# Patient Record
Sex: Female | Born: 1960 | Race: Black or African American | Hispanic: No | Marital: Married | Smoking: Never smoker
Health system: Southern US, Community
[De-identification: ages and names within clinical notes are randomized; demographics above are authoritative.]

## PROBLEM LIST (undated history)

## (undated) DIAGNOSIS — D649 Anemia, unspecified: Secondary | ICD-10-CM

## (undated) DIAGNOSIS — E7800 Pure hypercholesterolemia, unspecified: Secondary | ICD-10-CM

## (undated) DIAGNOSIS — K219 Gastro-esophageal reflux disease without esophagitis: Secondary | ICD-10-CM

## (undated) DIAGNOSIS — R7309 Other abnormal glucose: Secondary | ICD-10-CM

## (undated) DIAGNOSIS — R42 Dizziness and giddiness: Secondary | ICD-10-CM

## (undated) DIAGNOSIS — I10 Essential (primary) hypertension: Secondary | ICD-10-CM

## (undated) HISTORY — DX: Dizziness and giddiness: R42

## (undated) HISTORY — DX: Anemia, unspecified: D64.9

## (undated) HISTORY — DX: Other abnormal glucose: R73.09

## (undated) HISTORY — PX: COLONOSCOPY: SHX174

---

## 2002-04-28 ENCOUNTER — Ambulatory Visit (HOSPITAL_COMMUNITY): Admission: RE | Admit: 2002-04-28 | Discharge: 2002-04-28 | Payer: Self-pay | Admitting: Family Medicine

## 2002-04-28 ENCOUNTER — Encounter: Payer: Self-pay | Admitting: Family Medicine

## 2003-12-06 ENCOUNTER — Ambulatory Visit (HOSPITAL_COMMUNITY): Admission: RE | Admit: 2003-12-06 | Discharge: 2003-12-06 | Payer: Self-pay | Admitting: Family Medicine

## 2004-01-20 ENCOUNTER — Ambulatory Visit (HOSPITAL_COMMUNITY): Admission: RE | Admit: 2004-01-20 | Discharge: 2004-01-20 | Payer: Self-pay | Admitting: Family Medicine

## 2004-02-02 ENCOUNTER — Ambulatory Visit (HOSPITAL_COMMUNITY): Admission: RE | Admit: 2004-02-02 | Discharge: 2004-02-02 | Payer: Self-pay | Admitting: Family Medicine

## 2004-03-31 ENCOUNTER — Ambulatory Visit (HOSPITAL_COMMUNITY): Admission: RE | Admit: 2004-03-31 | Discharge: 2004-03-31 | Payer: Self-pay | Admitting: Otolaryngology

## 2004-08-01 ENCOUNTER — Ambulatory Visit: Payer: Self-pay | Admitting: Family Medicine

## 2004-11-23 ENCOUNTER — Ambulatory Visit: Payer: Self-pay | Admitting: Family Medicine

## 2004-12-08 ENCOUNTER — Ambulatory Visit: Payer: Self-pay | Admitting: Family Medicine

## 2005-03-05 ENCOUNTER — Ambulatory Visit: Payer: Self-pay | Admitting: Family Medicine

## 2005-03-16 ENCOUNTER — Ambulatory Visit (HOSPITAL_COMMUNITY): Admission: RE | Admit: 2005-03-16 | Discharge: 2005-03-16 | Payer: Self-pay | Admitting: Family Medicine

## 2005-07-10 ENCOUNTER — Ambulatory Visit: Payer: Self-pay | Admitting: Family Medicine

## 2005-08-28 ENCOUNTER — Ambulatory Visit: Payer: Self-pay | Admitting: Family Medicine

## 2005-09-19 ENCOUNTER — Encounter: Payer: Self-pay | Admitting: Family Medicine

## 2005-09-19 LAB — CONVERTED CEMR LAB: TSH: 1.111 microintl units/mL

## 2006-01-14 ENCOUNTER — Ambulatory Visit: Payer: Self-pay | Admitting: Family Medicine

## 2006-01-14 ENCOUNTER — Encounter: Payer: Self-pay | Admitting: Family Medicine

## 2006-01-14 ENCOUNTER — Other Ambulatory Visit: Admission: RE | Admit: 2006-01-14 | Discharge: 2006-01-14 | Payer: Self-pay | Admitting: Family Medicine

## 2006-02-25 ENCOUNTER — Ambulatory Visit: Payer: Self-pay | Admitting: Family Medicine

## 2006-04-30 ENCOUNTER — Ambulatory Visit: Payer: Self-pay | Admitting: Family Medicine

## 2007-02-21 ENCOUNTER — Other Ambulatory Visit: Admission: RE | Admit: 2007-02-21 | Discharge: 2007-02-21 | Payer: Self-pay | Admitting: Family Medicine

## 2007-02-21 ENCOUNTER — Encounter: Payer: Self-pay | Admitting: Family Medicine

## 2007-02-21 ENCOUNTER — Ambulatory Visit: Payer: Self-pay | Admitting: Family Medicine

## 2007-02-21 LAB — CONVERTED CEMR LAB: Pap Smear: NORMAL

## 2007-02-28 ENCOUNTER — Encounter: Payer: Self-pay | Admitting: Family Medicine

## 2007-02-28 LAB — CONVERTED CEMR LAB
BUN: 8 mg/dL (ref 6–23)
Basophils Absolute: 0 10*3/uL (ref 0.0–0.1)
Basophils Relative: 1 % (ref 0–1)
CO2: 27 meq/L (ref 19–32)
Calcium: 9.4 mg/dL (ref 8.4–10.5)
Chloride: 104 meq/L (ref 96–112)
Cholesterol: 296 mg/dL — ABNORMAL HIGH (ref 0–200)
Creatinine, Ser: 0.66 mg/dL (ref 0.40–1.20)
Eosinophils Absolute: 0.1 10*3/uL (ref 0.0–0.7)
Eosinophils Relative: 2 % (ref 0–5)
Glucose, Bld: 100 mg/dL — ABNORMAL HIGH (ref 70–99)
HCT: 32.4 % — ABNORMAL LOW (ref 36.0–46.0)
HDL: 55 mg/dL (ref >39)
Hemoglobin: 10 g/dL — ABNORMAL LOW (ref 12.0–15.0)
LDL Cholesterol: 224 mg/dL — ABNORMAL HIGH (ref 0–99)
Lymphocytes Relative: 30 % (ref 12–46)
Lymphs Abs: 1.8 10*3/uL (ref 0.7–3.3)
MCHC: 30.9 g/dL (ref 30.0–36.0)
MCV: 82.7 fL (ref 78.0–100.0)
Monocytes Absolute: 0.5 10*3/uL (ref 0.2–0.7)
Monocytes Relative: 8 % (ref 3–11)
Neutro Abs: 3.7 10*3/uL (ref 1.7–7.7)
Neutrophils Relative %: 61 % (ref 43–77)
Platelets: 425 10*3/uL — ABNORMAL HIGH (ref 150–400)
Potassium: 3.6 meq/L (ref 3.5–5.3)
RBC: 3.92 M/uL (ref 3.87–5.11)
RDW: 15.4 % — ABNORMAL HIGH (ref 11.5–14.0)
Sodium: 141 meq/L (ref 135–145)
Total CHOL/HDL Ratio: 5.4
Triglycerides: 85 mg/dL (ref <150)
VLDL: 17 mg/dL (ref 0–40)
WBC: 6.1 10*3/uL (ref 4.0–10.5)

## 2007-03-03 ENCOUNTER — Encounter: Payer: Self-pay | Admitting: Family Medicine

## 2007-03-03 LAB — CONVERTED CEMR LAB
ALT: 14 units/L (ref 0–35)
AST: 19 units/L (ref 0–37)
Albumin: 4.5 g/dL (ref 3.5–5.2)
Alkaline Phosphatase: 57 units/L (ref 39–117)
Bilirubin, Direct: <0.1 mg/dL (ref 0.0–0.3)
Ferritin: 14 ng/mL (ref 10–291)
Folate: >20 ng/mL
Iron: 28 ug/dL — ABNORMAL LOW (ref 42–145)
Saturation Ratios: 8 % — ABNORMAL LOW (ref 20–55)
TIBC: 356 ug/dL (ref 250–470)
Total Bilirubin: 0.2 mg/dL — ABNORMAL LOW (ref 0.3–1.2)
Total Protein: 7.2 g/dL (ref 6.0–8.3)
UIBC: 328 ug/dL
Vitamin B-12: 564 pg/mL (ref 211–911)

## 2007-08-08 ENCOUNTER — Ambulatory Visit: Payer: Self-pay | Admitting: Family Medicine

## 2007-09-04 ENCOUNTER — Encounter: Payer: Self-pay | Admitting: Family Medicine

## 2007-09-23 ENCOUNTER — Encounter: Payer: Self-pay | Admitting: Family Medicine

## 2007-09-23 DIAGNOSIS — N760 Acute vaginitis: Secondary | ICD-10-CM | POA: Insufficient documentation

## 2007-09-23 DIAGNOSIS — N951 Menopausal and female climacteric states: Secondary | ICD-10-CM

## 2007-09-23 DIAGNOSIS — E785 Hyperlipidemia, unspecified: Secondary | ICD-10-CM

## 2007-09-23 DIAGNOSIS — I10 Essential (primary) hypertension: Secondary | ICD-10-CM

## 2007-10-20 ENCOUNTER — Emergency Department (HOSPITAL_COMMUNITY): Admission: EM | Admit: 2007-10-20 | Discharge: 2007-10-21 | Payer: Self-pay | Admitting: Emergency Medicine

## 2007-10-21 ENCOUNTER — Ambulatory Visit (HOSPITAL_COMMUNITY): Admission: RE | Admit: 2007-10-21 | Discharge: 2007-10-21 | Payer: Self-pay | Admitting: Emergency Medicine

## 2009-02-28 ENCOUNTER — Emergency Department (HOSPITAL_COMMUNITY): Admission: EM | Admit: 2009-02-28 | Discharge: 2009-02-28 | Payer: Self-pay | Admitting: Emergency Medicine

## 2009-07-01 ENCOUNTER — Ambulatory Visit (HOSPITAL_COMMUNITY): Admission: RE | Admit: 2009-07-01 | Discharge: 2009-07-01 | Payer: Self-pay | Admitting: Obstetrics and Gynecology

## 2010-09-24 ENCOUNTER — Encounter: Payer: Self-pay | Admitting: Family Medicine

## 2010-10-03 NOTE — Letter (Signed)
Summary: RPC chart  RPC chart   Imported By: Curtis Sites 03/08/2010 13:23:56  _____________________________________________________________________  External Attachment:    Type:   Image     Comment:   External Document

## 2010-12-07 LAB — COMPREHENSIVE METABOLIC PANEL
ALT: 13 U/L (ref 0–35)
AST: 18 U/L (ref 0–37)
Albumin: 4 g/dL (ref 3.5–5.2)
Alkaline Phosphatase: 56 U/L (ref 39–117)
BUN: 4 mg/dL — ABNORMAL LOW (ref 6–23)
CO2: 28 mEq/L (ref 19–32)
Calcium: 9.2 mg/dL (ref 8.4–10.5)
Chloride: 102 mEq/L (ref 96–112)
Creatinine, Ser: 0.54 mg/dL (ref 0.4–1.2)
GFR calc Af Amer: >60 mL/min (ref >60)
GFR calc non Af Amer: >60 mL/min (ref >60)
Glucose, Bld: 124 mg/dL — ABNORMAL HIGH (ref 70–99)
Potassium: 2.9 mEq/L — ABNORMAL LOW (ref 3.5–5.1)
Sodium: 137 mEq/L (ref 135–145)
Total Bilirubin: 0.2 mg/dL — ABNORMAL LOW (ref 0.3–1.2)
Total Protein: 6.7 g/dL (ref 6.0–8.3)

## 2010-12-07 LAB — CBC
HCT: 31.2 % — ABNORMAL LOW (ref 36.0–46.0)
Hemoglobin: 10.1 g/dL — ABNORMAL LOW (ref 12.0–15.0)
MCHC: 32.5 g/dL (ref 30.0–36.0)
MCV: 75.3 fL — ABNORMAL LOW (ref 78.0–100.0)
Platelets: 341 10*3/uL (ref 150–400)
RDW: 15.4 % (ref 11.5–15.5)
WBC: 6.4 10*3/uL (ref 4.0–10.5)

## 2010-12-07 LAB — HCG, QUANTITATIVE, PREGNANCY: hCG, Beta Chain, Quant, S: <2 m[IU]/mL (ref <5)

## 2010-12-11 LAB — URINALYSIS, ROUTINE W REFLEX MICROSCOPIC
Glucose, UA: NEGATIVE mg/dL
Leukocytes, UA: NEGATIVE
Nitrite: NEGATIVE
Protein, ur: NEGATIVE mg/dL
Specific Gravity, Urine: 1.01 (ref 1.005–1.030)
Urobilinogen, UA: 0.2 mg/dL (ref 0.0–1.0)
pH: 7 (ref 5.0–8.0)

## 2010-12-11 LAB — URINE MICROSCOPIC-ADD ON

## 2010-12-11 LAB — DIFFERENTIAL
Basophils Relative: 0 % (ref 0–1)
Eosinophils Absolute: 0 10*3/uL (ref 0.0–0.7)
Eosinophils Relative: 0 % (ref 0–5)
Lymphocytes Relative: 14 % (ref 12–46)
Lymphs Abs: 1.4 10*3/uL (ref 0.7–4.0)
Neutro Abs: 8.7 10*3/uL — ABNORMAL HIGH (ref 1.7–7.7)

## 2010-12-11 LAB — CBC
HCT: 32.3 % — ABNORMAL LOW (ref 36.0–46.0)
MCV: 88.8 fL (ref 78.0–100.0)
RDW: 15 % (ref 11.5–15.5)

## 2010-12-11 LAB — BASIC METABOLIC PANEL
CO2: 26 mEq/L (ref 19–32)
GFR calc Af Amer: >60 mL/min (ref >60)
GFR calc non Af Amer: >60 mL/min (ref >60)
Potassium: 3.1 mEq/L — ABNORMAL LOW (ref 3.5–5.1)

## 2010-12-11 LAB — WET PREP, GENITAL: Yeast Wet Prep HPF POC: NONE SEEN

## 2012-05-04 ENCOUNTER — Encounter (HOSPITAL_COMMUNITY): Payer: Self-pay | Admitting: Emergency Medicine

## 2012-05-04 ENCOUNTER — Emergency Department (HOSPITAL_COMMUNITY)
Admission: EM | Admit: 2012-05-04 | Discharge: 2012-05-04 | Disposition: A | Discharge status: Home / Self Care | Payer: Self-pay | Attending: Emergency Medicine | Admitting: Emergency Medicine

## 2012-05-04 DIAGNOSIS — K644 Residual hemorrhoidal skin tags: Secondary | ICD-10-CM | POA: Insufficient documentation

## 2012-05-04 DIAGNOSIS — I10 Essential (primary) hypertension: Secondary | ICD-10-CM | POA: Insufficient documentation

## 2012-05-04 DIAGNOSIS — E7800 Pure hypercholesterolemia, unspecified: Secondary | ICD-10-CM | POA: Insufficient documentation

## 2012-05-04 HISTORY — DX: Pure hypercholesterolemia, unspecified: E78.00

## 2012-05-04 HISTORY — DX: Essential (primary) hypertension: I10

## 2012-05-04 NOTE — ED Provider Notes (Signed)
History     CSN: [id number]  Arrival date & time 05/04/12  1539   First MD Initiated Contact with Patient 05/04/12 1726      Chief Complaint  Patient presents with  . Hemorrhoids    (Consider location/radiation/quality/duration/timing/severity/associated sxs/prior treatment) HPI Comments: Pt has had  Hemorrhoids ~ 20 years but they have enlarged and become more painful in the past couple weeks.  She saw her NP provider at [facility] and was prescribed a cortisone suppositories and lidocaine jelly.  "not helping".  The history is provided by the patient. No language interpreter was used.    Past Medical History  Diagnosis Date  . Hypertension   . High cholesterol     Past Surgical History  Procedure Date  . Cesarean section     No family history on file.  History  Substance Use Topics  . Smoking status: Never Smoker   . Smokeless tobacco: Not on file  . Alcohol Use: No    OB History    Grav Para Term Preterm Abortions TAB SAB Ect Mult Living                  Review of Systems  Constitutional: Negative for fever.  Gastrointestinal: Positive for rectal pain. Negative for diarrhea and constipation.  All other systems reviewed and are negative.    Allergies  Meperidine hcl  Home Medications  No current outpatient prescriptions on file.  BP 143/84  Pulse 85  Temp 98.7 F (37.1 C) (Oral)  Resp 20  Ht 5\' 4"  (1.626 m)  Wt 162 lb (73.483 kg)  BMI 27.81 kg/m2  SpO2 100%  Physical Exam  Nursing note and vitals reviewed. Constitutional: She is oriented to person, place, and time. She appears well-developed and well-nourished. No distress.  HENT:  Head: Normocephalic and atraumatic.  Eyes: EOM are normal.  Neck: Normal range of motion.  Cardiovascular: Normal rate, regular rhythm and normal heart sounds.   Pulmonary/Chest: Effort normal and breath sounds normal.  Abdominal: Soft. She exhibits no distension. There is no tenderness.       Pt has 2  large hemorrhoids that are not thrombosed.  They will only partially and temporarily reduce with pressure.  They are very tender to palpation.  Musculoskeletal: Normal range of motion.  Neurological: She is alert and oriented to person, place, and time.  Skin: Skin is warm and dry.  Psychiatric: She has a normal mood and affect. Judgment normal.    ED Course  Procedures (including critical care time)  Labs Reviewed - No data to display No results found.   1. Hemorrhoids, external       MDM  Continue cortisone supp and lidocaine jelly Call dr. Leticia Penna for surgical consult.        Evalina Field, [place] 05/04/12 [phone]

## 2012-05-04 NOTE — ED Notes (Signed)
Pt c/o hemrroids. H/s same.

## 2012-05-04 NOTE — ED Provider Notes (Signed)
Medical screening examination/treatment/procedure(s) were performed by non-physician practitioner and as supervising physician I was immediately available for consultation/collaboration.   Bonnie Overdorf L Arty Lantzy, MD 05/04/12 2238 

## 2012-08-29 ENCOUNTER — Encounter (HOSPITAL_COMMUNITY): Payer: Self-pay | Admitting: *Deleted

## 2012-08-29 ENCOUNTER — Emergency Department (HOSPITAL_COMMUNITY)
Admission: EM | Admit: 2012-08-29 | Discharge: 2012-08-29 | Disposition: A | Discharge status: Home / Self Care | Payer: Self-pay | Attending: Emergency Medicine | Admitting: Emergency Medicine

## 2012-08-29 DIAGNOSIS — H6980 Other specified disorders of Eustachian tube, unspecified ear: Secondary | ICD-10-CM | POA: Insufficient documentation

## 2012-08-29 DIAGNOSIS — Z7982 Long term (current) use of aspirin: Secondary | ICD-10-CM | POA: Insufficient documentation

## 2012-08-29 DIAGNOSIS — J3489 Other specified disorders of nose and nasal sinuses: Secondary | ICD-10-CM | POA: Insufficient documentation

## 2012-08-29 DIAGNOSIS — H6990 Unspecified Eustachian tube disorder, unspecified ear: Secondary | ICD-10-CM | POA: Insufficient documentation

## 2012-08-29 DIAGNOSIS — I10 Essential (primary) hypertension: Secondary | ICD-10-CM | POA: Insufficient documentation

## 2012-08-29 DIAGNOSIS — E7800 Pure hypercholesterolemia, unspecified: Secondary | ICD-10-CM | POA: Insufficient documentation

## 2012-08-29 DIAGNOSIS — Z79899 Other long term (current) drug therapy: Secondary | ICD-10-CM | POA: Insufficient documentation

## 2012-08-29 MED ORDER — PSEUDOEPHEDRINE HCL 60 MG PO TABS
60.0000 mg | ORAL_TABLET | Freq: Once | ORAL | Status: AC
  Administered 2012-08-29: 60 mg via ORAL
  Filled 2012-08-29: qty 1

## 2012-08-29 MED ORDER — AZITHROMYCIN 250 MG PO TABS: ORAL_TABLET | ORAL | Status: DC

## 2012-08-29 MED ORDER — AZITHROMYCIN 250 MG PO TABS
500.0000 mg | ORAL_TABLET | Freq: Once | ORAL | Status: AC
  Administered 2012-08-29: 500 mg via ORAL
  Filled 2012-08-29: qty 2

## 2012-08-29 NOTE — ED Notes (Signed)
Pt states right ear pain since Wednesday. NAD.

## 2012-08-29 NOTE — ED Provider Notes (Signed)
History     CSN: [id number]  Arrival date & time 08/29/12  1703   First MD Initiated Contact with Patient 08/29/12 1934      Chief Complaint  Patient presents with  . Otalgia    (Consider location/radiation/quality/duration/timing/severity/associated sxs/prior treatment) HPI Comments: Nasal congestion and R ear pain  Patient is a 51 y.o. female presenting with ear pain. The history is provided by the patient.  Otalgia This is a new problem. The current episode started 2 days ago. There is pain in the right ear. The problem occurs constantly. The problem has not changed since onset.There has been no fever. Pertinent negatives include no ear discharge, no hearing loss, no sore throat, no diarrhea, no vomiting, no cough and no rash. Her past medical history does not include chronic ear infection, hearing loss or tympanostomy tube.    Past Medical History  Diagnosis Date  . Hypertension   . High cholesterol     Past Surgical History  Procedure Date  . Cesarean section     No family history on file.  History  Substance Use Topics  . Smoking status: Never Smoker   . Smokeless tobacco: Not on file  . Alcohol Use: No    OB History    Grav Para Term Preterm Abortions TAB SAB Ect Mult Living                  Review of Systems  Constitutional: Negative for fever and chills.  HENT: Positive for ear pain. Negative for hearing loss, sore throat and ear discharge.   Respiratory: Negative for cough.   Gastrointestinal: Negative for vomiting and diarrhea.  Skin: Negative for rash.  All other systems reviewed and are negative.    Allergies  Codeine and Meperidine hcl  Home Medications   Current Outpatient Rx  Name  Route  Sig  Dispense  Refill  . ASPIRIN EC 81 MG PO TBEC   Oral   Take 81 mg by mouth daily.         Marland Kitchen FERROUS SULFATE 325 (65 FE) MG PO TABS   Oral   Take 325 mg by mouth daily.         Marland Kitchen HYDROCHLOROTHIAZIDE 25 MG PO TABS   Oral   Take 25 mg  by mouth daily.         . ADULT MULTIVITAMIN W/MINERALS CH   Oral   Take 1 tablet by mouth daily.         Marland Kitchen POTASSIUM CHLORIDE ER 10 MEQ PO TBCR   Oral   Take 10 mEq by mouth daily.         Marland Kitchen PRESCRIPTION MEDICATION   Oral   Take 1 tablet by mouth daily. For CHOLESTEROL         . AZITHROMYCIN 250 MG PO TABS      One tab po QD (intial dose given in  The ED)   4 tablet   0     BP 143/85  Pulse 87  Temp 97.9 F (36.6 C) (Oral)  Resp 16  Ht 5\' 4"  (1.626 m)  Wt 162 lb (73.483 kg)  BMI 27.81 kg/m2  SpO2 100%  Physical Exam  Nursing note and vitals reviewed. Constitutional: She is oriented to person, place, and time. She appears well-developed and well-nourished. No distress.  HENT:  Head: Normocephalic and atraumatic.  Right Ear: Hearing, tympanic membrane, external ear and ear canal normal.  Left Ear: Hearing, tympanic membrane, external ear and ear  canal normal.  Eyes: EOM are normal.  Neck: Normal range of motion.  Cardiovascular: Normal rate, regular rhythm and normal heart sounds.   Pulmonary/Chest: Effort normal and breath sounds normal.  Abdominal: Soft. She exhibits no distension. There is no tenderness.  Musculoskeletal: Normal range of motion.  Neurological: She is alert and oriented to person, place, and time.  Skin: Skin is warm and dry.  Psychiatric: She has a normal mood and affect. Judgment normal.    ED Course  Procedures (including critical care time)  Labs Reviewed - No data to display No results found.   1. ETD (eustachian tube dysfunction)       MDM  rx-zithromax 250 mg x 4 Sudafed F/u with PCP        Evalina Field, PA 08/29/12 2123

## 2012-08-29 NOTE — ED Notes (Signed)
Rt earache for 3 days, no injury , sinus congestion. Present.

## 2012-08-30 NOTE — ED Provider Notes (Signed)
Medical screening examination/treatment/procedure(s) were performed by non-physician practitioner and as supervising physician I was immediately available for consultation/collaboration.  Donnetta Hutching, MD 08/30/12 Marlyne Beards

## 2012-10-09 ENCOUNTER — Other Ambulatory Visit (HOSPITAL_COMMUNITY): Payer: Self-pay | Admitting: Family Medicine

## 2012-10-09 DIAGNOSIS — Z139 Encounter for screening, unspecified: Secondary | ICD-10-CM

## 2012-12-19 ENCOUNTER — Ambulatory Visit (HOSPITAL_COMMUNITY): Payer: Self-pay

## 2013-02-02 ENCOUNTER — Ambulatory Visit (HOSPITAL_COMMUNITY): Payer: Self-pay

## 2013-02-02 ENCOUNTER — Other Ambulatory Visit (HOSPITAL_COMMUNITY): Payer: Self-pay | Admitting: *Deleted

## 2013-02-02 ENCOUNTER — Ambulatory Visit (HOSPITAL_COMMUNITY)
Admission: RE | Admit: 2013-02-02 | Discharge: 2013-02-02 | Disposition: A | Discharge status: Home / Self Care | Payer: Self-pay | Source: Ambulatory Visit | Attending: Family Medicine | Admitting: Family Medicine

## 2013-02-02 DIAGNOSIS — Z1231 Encounter for screening mammogram for malignant neoplasm of breast: Secondary | ICD-10-CM | POA: Insufficient documentation

## 2013-02-02 DIAGNOSIS — M545 Low back pain, unspecified: Secondary | ICD-10-CM | POA: Insufficient documentation

## 2013-02-02 DIAGNOSIS — G8929 Other chronic pain: Secondary | ICD-10-CM

## 2013-02-02 DIAGNOSIS — Z139 Encounter for screening, unspecified: Secondary | ICD-10-CM

## 2013-02-13 ENCOUNTER — Other Ambulatory Visit: Payer: Self-pay | Admitting: *Deleted

## 2013-02-13 DIAGNOSIS — R928 Other abnormal and inconclusive findings on diagnostic imaging of breast: Secondary | ICD-10-CM

## 2013-02-16 ENCOUNTER — Other Ambulatory Visit: Payer: Self-pay | Admitting: Family Medicine

## 2013-02-16 DIAGNOSIS — R928 Other abnormal and inconclusive findings on diagnostic imaging of breast: Secondary | ICD-10-CM

## 2013-02-25 ENCOUNTER — Ambulatory Visit (HOSPITAL_COMMUNITY)
Admission: RE | Admit: 2013-02-25 | Discharge: 2013-02-25 | Disposition: A | Discharge status: Home / Self Care | Payer: Self-pay | Source: Ambulatory Visit | Attending: Family Medicine | Admitting: Family Medicine

## 2013-02-25 DIAGNOSIS — R928 Other abnormal and inconclusive findings on diagnostic imaging of breast: Secondary | ICD-10-CM | POA: Insufficient documentation

## 2014-04-08 ENCOUNTER — Other Ambulatory Visit (HOSPITAL_COMMUNITY): Payer: Self-pay | Admitting: Family Medicine

## 2014-04-08 DIAGNOSIS — Z1231 Encounter for screening mammogram for malignant neoplasm of breast: Secondary | ICD-10-CM

## 2014-04-12 ENCOUNTER — Ambulatory Visit (HOSPITAL_COMMUNITY)
Admission: RE | Admit: 2014-04-12 | Discharge: 2014-04-12 | Disposition: A | Discharge status: Home / Self Care | Payer: Self-pay | Source: Ambulatory Visit | Attending: Family Medicine | Admitting: Family Medicine

## 2014-04-12 DIAGNOSIS — Z1231 Encounter for screening mammogram for malignant neoplasm of breast: Secondary | ICD-10-CM | POA: Insufficient documentation

## 2014-06-10 ENCOUNTER — Ambulatory Visit (INDEPENDENT_AMBULATORY_CARE_PROVIDER_SITE_OTHER): Payer: Self-pay | Admitting: Otolaryngology

## 2014-11-30 ENCOUNTER — Encounter: Payer: Self-pay | Admitting: *Deleted

## 2014-12-08 ENCOUNTER — Other Ambulatory Visit: Payer: Self-pay | Admitting: Obstetrics and Gynecology

## 2014-12-08 ENCOUNTER — Encounter: Payer: Self-pay | Admitting: Obstetrics and Gynecology

## 2015-05-31 ENCOUNTER — Other Ambulatory Visit (HOSPITAL_COMMUNITY): Payer: Self-pay | Admitting: Internal Medicine

## 2015-05-31 DIAGNOSIS — Z1231 Encounter for screening mammogram for malignant neoplasm of breast: Secondary | ICD-10-CM

## 2015-06-06 ENCOUNTER — Ambulatory Visit (HOSPITAL_COMMUNITY)
Admission: RE | Admit: 2015-06-06 | Discharge: 2015-06-06 | Disposition: A | Discharge status: Home / Self Care | Payer: 59 | Source: Ambulatory Visit | Attending: Internal Medicine | Admitting: Internal Medicine

## 2015-06-06 DIAGNOSIS — Z1231 Encounter for screening mammogram for malignant neoplasm of breast: Secondary | ICD-10-CM | POA: Insufficient documentation

## 2015-06-08 ENCOUNTER — Other Ambulatory Visit: Payer: Self-pay | Admitting: Internal Medicine

## 2015-06-08 DIAGNOSIS — R928 Other abnormal and inconclusive findings on diagnostic imaging of breast: Secondary | ICD-10-CM

## 2015-06-14 ENCOUNTER — Other Ambulatory Visit (HOSPITAL_COMMUNITY): Payer: Self-pay | Admitting: Internal Medicine

## 2015-06-14 ENCOUNTER — Ambulatory Visit (HOSPITAL_COMMUNITY)
Admission: RE | Admit: 2015-06-14 | Discharge: 2015-06-14 | Disposition: A | Discharge status: Home / Self Care | Payer: 59 | Source: Ambulatory Visit | Attending: Internal Medicine | Admitting: Internal Medicine

## 2015-06-14 DIAGNOSIS — N6310 Unspecified lump in the right breast, unspecified quadrant: Secondary | ICD-10-CM

## 2015-06-14 DIAGNOSIS — R928 Other abnormal and inconclusive findings on diagnostic imaging of breast: Secondary | ICD-10-CM

## 2015-06-14 DIAGNOSIS — N6001 Solitary cyst of right breast: Secondary | ICD-10-CM | POA: Diagnosis present

## 2016-02-13 ENCOUNTER — Emergency Department (HOSPITAL_COMMUNITY): Payer: BLUE CROSS/BLUE SHIELD

## 2016-02-13 ENCOUNTER — Encounter (HOSPITAL_COMMUNITY): Payer: Self-pay | Admitting: Emergency Medicine

## 2016-02-13 ENCOUNTER — Emergency Department (HOSPITAL_COMMUNITY)
Admission: EM | Admit: 2016-02-13 | Discharge: 2016-02-13 | Disposition: A | Discharge status: Home / Self Care | Payer: BLUE CROSS/BLUE SHIELD | Attending: Emergency Medicine | Admitting: Emergency Medicine

## 2016-02-13 DIAGNOSIS — Z79899 Other long term (current) drug therapy: Secondary | ICD-10-CM | POA: Insufficient documentation

## 2016-02-13 DIAGNOSIS — R5383 Other fatigue: Secondary | ICD-10-CM | POA: Insufficient documentation

## 2016-02-13 DIAGNOSIS — M549 Dorsalgia, unspecified: Secondary | ICD-10-CM | POA: Diagnosis not present

## 2016-02-13 DIAGNOSIS — R059 Cough, unspecified: Secondary | ICD-10-CM

## 2016-02-13 DIAGNOSIS — R0981 Nasal congestion: Secondary | ICD-10-CM | POA: Diagnosis not present

## 2016-02-13 DIAGNOSIS — R05 Cough: Secondary | ICD-10-CM

## 2016-02-13 DIAGNOSIS — R319 Hematuria, unspecified: Secondary | ICD-10-CM | POA: Insufficient documentation

## 2016-02-13 DIAGNOSIS — I10 Essential (primary) hypertension: Secondary | ICD-10-CM | POA: Insufficient documentation

## 2016-02-13 LAB — URINALYSIS, ROUTINE W REFLEX MICROSCOPIC
BILIRUBIN URINE: NEGATIVE
GLUCOSE, UA: NEGATIVE mg/dL
KETONES UR: NEGATIVE mg/dL
LEUKOCYTES UA: NEGATIVE
Nitrite: NEGATIVE
PH: 5.5 (ref 5.0–8.0)
SPECIFIC GRAVITY, URINE: 1.025 (ref 1.005–1.030)

## 2016-02-13 LAB — URINE MICROSCOPIC-ADD ON: WBC UA: NONE SEEN WBC/hpf (ref 0–5)

## 2016-02-13 NOTE — ED Provider Notes (Signed)
CSN: [id number]     Arrival date & time 02/13/16  0831 History   By signing my name below, I, Roxine Caddy, attest that this documentation has been prepared under the direction and in the presence of Elnora Morrison, MD.  Electronically signed: Roxine Caddy, ED Scribe. 02/13/2016. 9:02 AM.    Chief Complaint  Patient presents with  . Cough   The history is provided by the patient. No language interpreter was used.   HPI Comments: Christina Dyer is a 55 y.o. female who presents to the Emergency Department complaining of a gradually improving, productive cough with onset 3 days ago. She states cough worsened 2 days ago when she produced mild blood with her sputum. Pt notes associated subjective fever, and fatigue. Pt reports right sided lower back pain. She has a h/o chronic back pain;  states her back pain is similar to previous episodes and is exacerbated with movement when she first stands up. She also notes increased urine frequency. She denies sick contacts and has not been out of the country recently. She denies dysuria, weakness or numbness of the extremities.   Past Medical History  Diagnosis Date  . Hypertension   . High cholesterol   . Abnormal glucose   . Anemia    Past Surgical History  Procedure Laterality Date  . Cesarean section     History reviewed. No pertinent family history. Social History  Substance Use Topics  . Smoking status: Never Smoker   . Smokeless tobacco: None  . Alcohol Use: No   OB History    No data available     Review of Systems  Constitutional: Positive for fever and fatigue.  HENT: Positive for congestion.   Genitourinary: Negative for dysuria and frequency.  Musculoskeletal: Positive for back pain.  Neurological: Negative for weakness and numbness.  All other systems reviewed and are negative.   Allergies  Codeine and Meperidine hcl  Home Medications   Prior to Admission medications   Medication Sig Start Date End Date Taking?  Authorizing Provider  hydrochlorothiazide (HYDRODIURIL) 25 MG tablet Take 25 mg by mouth daily.    Historical Provider, MD  losartan (COZAAR) 25 MG tablet Take 25 mg by mouth daily.    Historical Provider, MD  rosuvastatin (CRESTOR) 20 MG tablet Take 20 mg by mouth daily.    Historical Provider, MD   BP 140/85 mmHg  Pulse 84  Temp(Src) 98.8 F (37.1 C)  Resp 18  Ht 5\' 4"  (1.626 m)  Wt 165 lb (74.844 kg)  BMI 28.31 kg/m2  SpO2 99% Physical Exam  Constitutional: She is oriented to person, place, and time. She appears well-developed and well-nourished. No distress.  HENT:  Head: Normocephalic and atraumatic.  Eyes: Conjunctivae are normal.  Cardiovascular: Normal rate, regular rhythm and normal heart sounds.   Pulmonary/Chest: Effort normal and breath sounds normal. No respiratory distress. She has no wheezes. She has no rales.  Abdominal: She exhibits no distension.  Musculoskeletal:  No midline tenderness There is right lumbar paraspinal tenderness  Neurological: She is alert and oriented to person, place, and time.  Skin: Skin is warm and dry. No rash noted.  Psychiatric: She has a normal mood and affect.  Nursing note and vitals reviewed.   ED Course  Procedures  DIAGNOSTIC STUDIES: Oxygen Saturation is 99% on RA, nromal by my interpretation.  COORDINATION OF CARE: 9:02 AM Discussed treatment plan which includes UA, CXR, and administering tylenol for pain with pt at bedside and pt  agreed to plan.  Labs Review Labs Reviewed - No data to display  Imaging Review No results found. I have personally reviewed and evaluated these images and lab results as part of my medical decision-making.   EKG Interpretation None      MDM   Final diagnoses:  None   Well-appearing patient presents with flulike symptoms. No meningismus on exam. No tick bites are concerning rashes. Chest x-ray unremarkable. Urinalysis unfortunately not a clean sample, culture be sent however only  right flank pain reproducible likely musculoskeletal. Patient has no other urinary symptoms no fever here to hold antibiotics until follow-up in 48 hours.  Results and differential diagnosis were discussed with the patient/parent/guardian. Xrays were independently reviewed by myself.  Close follow up outpatient was discussed, comfortable with the plan.   Medications - No data to display  Filed Vitals:   02/13/16 0841  BP: 140/85  Pulse: 84  Temp: 98.8 F (37.1 C)  Resp: 18  Height: 5\' 4"  (1.626 m)  Weight: 165 lb (74.844 kg)  SpO2: 99%    Final diagnoses:  Cough  Hematuria      Elnora Morrison, MD 02/15/16 1621

## 2016-02-13 NOTE — Discharge Instructions (Signed)
Tylenol every 4 hrs for aches and fevers. Follow up for urine culture result and recheck in 2 to 3 days. If you were given medicines take as directed.  If you are on coumadin or contraceptives realize their levels and effectiveness is altered by many different medicines.  If you have any reaction (rash, tongues swelling, other) to the medicines stop taking and see a physician.    If your blood pressure was elevated in the ER make sure you follow up for management with a primary doctor or return for chest pain, shortness of breath or stroke symptoms.  Please follow up as directed and return to the ER or see a physician for new or worsening symptoms.  Thank you. Filed Vitals:   02/13/16 0841  BP: 140/85  Pulse: 84  Temp: 98.8 F (37.1 C)  Resp: 18  Height: 5\' 4"  (1.626 m)  Weight: 165 lb (74.844 kg)  SpO2: 99%

## 2016-02-13 NOTE — ED Notes (Signed)
Pt states she has been experiencing "flu like symptoms" since Saturday with productive cough, fatigue, and aches.  Denies n/v/d.

## 2016-02-14 LAB — URINE CULTURE: Culture: <10000 — AB

## 2016-02-15 ENCOUNTER — Other Ambulatory Visit (HOSPITAL_COMMUNITY): Payer: Self-pay | Admitting: Internal Medicine

## 2016-02-15 DIAGNOSIS — R3129 Other microscopic hematuria: Secondary | ICD-10-CM

## 2016-02-23 ENCOUNTER — Ambulatory Visit (HOSPITAL_COMMUNITY)
Admission: RE | Admit: 2016-02-23 | Discharge: 2016-02-23 | Disposition: A | Discharge status: Home / Self Care | Payer: BLUE CROSS/BLUE SHIELD | Source: Ambulatory Visit | Attending: Internal Medicine | Admitting: Internal Medicine

## 2016-02-23 DIAGNOSIS — R319 Hematuria, unspecified: Secondary | ICD-10-CM | POA: Insufficient documentation

## 2016-02-23 DIAGNOSIS — K8020 Calculus of gallbladder without cholecystitis without obstruction: Secondary | ICD-10-CM | POA: Insufficient documentation

## 2016-02-23 DIAGNOSIS — R3129 Other microscopic hematuria: Secondary | ICD-10-CM

## 2016-02-23 DIAGNOSIS — N3289 Other specified disorders of bladder: Secondary | ICD-10-CM | POA: Insufficient documentation

## 2016-07-12 ENCOUNTER — Encounter (HOSPITAL_COMMUNITY): Payer: Self-pay

## 2016-07-12 ENCOUNTER — Observation Stay (HOSPITAL_COMMUNITY)
Admission: EM | Admit: 2016-07-12 | Discharge: 2016-07-12 | Disposition: A | Discharge status: Home / Self Care | Payer: BLUE CROSS/BLUE SHIELD | Attending: Internal Medicine | Admitting: Internal Medicine

## 2016-07-12 ENCOUNTER — Emergency Department (HOSPITAL_COMMUNITY): Payer: BLUE CROSS/BLUE SHIELD

## 2016-07-12 DIAGNOSIS — I10 Essential (primary) hypertension: Secondary | ICD-10-CM | POA: Insufficient documentation

## 2016-07-12 DIAGNOSIS — R42 Dizziness and giddiness: Secondary | ICD-10-CM

## 2016-07-12 DIAGNOSIS — Z79899 Other long term (current) drug therapy: Secondary | ICD-10-CM | POA: Insufficient documentation

## 2016-07-12 DIAGNOSIS — R112 Nausea with vomiting, unspecified: Secondary | ICD-10-CM | POA: Diagnosis not present

## 2016-07-12 DIAGNOSIS — H8112 Benign paroxysmal vertigo, left ear: Secondary | ICD-10-CM | POA: Diagnosis present

## 2016-07-12 DIAGNOSIS — E876 Hypokalemia: Principal | ICD-10-CM | POA: Insufficient documentation

## 2016-07-12 LAB — CBC
HCT: 37.2 % (ref 36.0–46.0)
HEMATOCRIT: 35.2 % — AB (ref 36.0–46.0)
HEMOGLOBIN: 12 g/dL (ref 12.0–15.0)
Hemoglobin: 12.8 g/dL (ref 12.0–15.0)
MCH: 29.5 pg (ref 26.0–34.0)
MCH: 29.4 pg (ref 26.0–34.0)
MCHC: 34.4 g/dL (ref 30.0–36.0)
MCHC: 34.1 g/dL (ref 30.0–36.0)
MCV: 85.7 fL (ref 78.0–100.0)
MCV: 86.3 fL (ref 78.0–100.0)
PLATELETS: 283 10*3/uL (ref 150–400)
PLATELETS: 255 10*3/uL (ref 150–400)
RBC: 4.34 MIL/uL (ref 3.87–5.11)
RBC: 4.08 MIL/uL (ref 3.87–5.11)
RDW: 13.1 % (ref 11.5–15.5)
RDW: 12.8 % (ref 11.5–15.5)
WBC: 8.8 10*3/uL (ref 4.0–10.5)
WBC: 12 10*3/uL — ABNORMAL HIGH (ref 4.0–10.5)

## 2016-07-12 LAB — BASIC METABOLIC PANEL
Anion gap: 10 (ref 5–15)
BUN: 10 mg/dL (ref 6–20)
CALCIUM: 9.8 mg/dL (ref 8.9–10.3)
CO2: 26 mmol/L (ref 22–32)
CREATININE: 0.67 mg/dL (ref 0.44–1.00)
Chloride: 103 mmol/L (ref 101–111)
Glucose, Bld: 197 mg/dL — ABNORMAL HIGH (ref 65–99)
Potassium: 2.7 mmol/L — CL (ref 3.5–5.1)
SODIUM: 139 mmol/L (ref 135–145)

## 2016-07-12 LAB — CREATININE, SERUM
Creatinine, Ser: 0.56 mg/dL (ref 0.44–1.00)
GFR calc Af Amer: >60 mL/min (ref >60)
GFR calc non Af Amer: >60 mL/min (ref >60)

## 2016-07-12 MED ORDER — POTASSIUM CHLORIDE CRYS ER 20 MEQ PO TBCR
80.0000 meq | EXTENDED_RELEASE_TABLET | Freq: Once | ORAL | Status: DC
Start: 2016-07-12 — End: 2016-07-12

## 2016-07-12 MED ORDER — ACETAMINOPHEN 650 MG RE SUPP: 650.0000 mg | Freq: Four times a day (QID) | RECTAL | Status: DC | PRN

## 2016-07-12 MED ORDER — ONDANSETRON HCL 4 MG PO TABS: 4.0000 mg | ORAL_TABLET | Freq: Four times a day (QID) | ORAL | Status: DC | PRN

## 2016-07-12 MED ORDER — PROMETHAZINE HCL 25 MG/ML IJ SOLN
25.0000 mg | Freq: Once | INTRAMUSCULAR | Status: AC
  Administered 2016-07-12: 25 mg via INTRAVENOUS
  Filled 2016-07-12: qty 1

## 2016-07-12 MED ORDER — SODIUM CHLORIDE 0.9 % IV SOLN: INTRAVENOUS | Status: DC

## 2016-07-12 MED ORDER — ONDANSETRON HCL 4 MG/2ML IJ SOLN: 4.0000 mg | Freq: Four times a day (QID) | INTRAMUSCULAR | Status: DC | PRN

## 2016-07-12 MED ORDER — POTASSIUM CHLORIDE 10 MEQ/100ML IV SOLN
10.0000 meq | INTRAVENOUS | Status: AC
  Administered 2016-07-12: 10 meq via INTRAVENOUS
  Filled 2016-07-12 (×2): qty 100

## 2016-07-12 MED ORDER — HYDRALAZINE HCL 20 MG/ML IJ SOLN: 10.0000 mg | INTRAMUSCULAR | Status: DC | PRN

## 2016-07-12 MED ORDER — SODIUM CHLORIDE 0.9 % IV BOLUS (SEPSIS)
1000.0000 mL | Freq: Once | INTRAVENOUS | Status: AC
  Administered 2016-07-12: 1000 mL via INTRAVENOUS

## 2016-07-12 MED ORDER — MECLIZINE HCL 12.5 MG PO TABS
25.0000 mg | ORAL_TABLET | Freq: Once | ORAL | Status: AC
  Administered 2016-07-12: 25 mg via ORAL
  Filled 2016-07-12: qty 2

## 2016-07-12 MED ORDER — ENALAPRILAT 1.25 MG/ML IV SOLN
0.6250 mg | Freq: Four times a day (QID) | INTRAVENOUS | Status: DC
Start: 2016-07-12 — End: 2016-07-12
  Administered 2016-07-12: 0.625 mg via INTRAVENOUS
  Filled 2016-07-12: qty 2

## 2016-07-12 MED ORDER — MECLIZINE HCL 25 MG PO TABS: 25.0000 mg | ORAL_TABLET | Freq: Three times a day (TID) | ORAL | 0 refills | Status: DC | PRN

## 2016-07-12 MED ORDER — SODIUM CHLORIDE 0.9 % IV SOLN
INTRAVENOUS | Status: DC
  Administered 2016-07-12: 09:00:00 via INTRAVENOUS

## 2016-07-12 MED ORDER — ONDANSETRON HCL 4 MG/2ML IJ SOLN
4.0000 mg | Freq: Once | INTRAMUSCULAR | Status: AC
  Administered 2016-07-12: 4 mg via INTRAVENOUS
  Filled 2016-07-12: qty 2

## 2016-07-12 MED ORDER — HEPARIN SODIUM (PORCINE) 5000 UNIT/ML IJ SOLN
5000.0000 [IU] | Freq: Three times a day (TID) | INTRAMUSCULAR | Status: DC
  Administered 2016-07-12 (×2): 5000 [IU] via SUBCUTANEOUS
  Filled 2016-07-12 (×2): qty 1

## 2016-07-12 MED ORDER — DIAZEPAM 5 MG/ML IJ SOLN
2.5000 mg | Freq: Once | INTRAMUSCULAR | Status: AC
  Administered 2016-07-12: 2.5 mg via INTRAVENOUS
  Filled 2016-07-12: qty 2

## 2016-07-12 MED ORDER — POTASSIUM CHLORIDE 10 MEQ/100ML IV SOLN
10.0000 meq | INTRAVENOUS | Status: AC
  Administered 2016-07-12: 10 meq via INTRAVENOUS

## 2016-07-12 MED ORDER — POTASSIUM CHLORIDE 10 MEQ/100ML IV SOLN
10.0000 meq | INTRAVENOUS | Status: AC
  Administered 2016-07-12 (×4): 10 meq via INTRAVENOUS
  Filled 2016-07-12 (×4): qty 100

## 2016-07-12 MED ORDER — MECLIZINE HCL 12.5 MG PO TABS: 25.0000 mg | ORAL_TABLET | Freq: Three times a day (TID) | ORAL | Status: DC | PRN

## 2016-07-12 MED ORDER — ACETAMINOPHEN 325 MG PO TABS: 650.0000 mg | ORAL_TABLET | Freq: Four times a day (QID) | ORAL | Status: DC | PRN

## 2016-07-12 NOTE — ED Notes (Signed)
Floor unable to take report at this time.

## 2016-07-12 NOTE — Discharge Instructions (Signed)
Benign Positional Vertigo Vertigo is the feeling that you or your surroundings are moving when they are not. Benign positional vertigo is the most common form of vertigo. The cause of this condition is not serious (is benign). This condition is triggered by certain movements and positions (is positional). This condition can be dangerous if it occurs while you are doing something that could endanger you or others, such as driving.  CAUSES In many cases, the cause of this condition is not known. It may be caused by a disturbance in an area of the inner ear that helps your brain to sense movement and balance. This disturbance can be caused by a viral infection (labyrinthitis), head injury, or repetitive motion. RISK FACTORS This condition is more likely to develop in:  Women.  People who are 50 years of age or older. SYMPTOMS Symptoms of this condition usually happen when you move your head or your eyes in different directions. Symptoms may start suddenly, and they usually last for less than a minute. Symptoms may include:  Loss of balance and falling.  Feeling like you are spinning or moving.  Feeling like your surroundings are spinning or moving.  Nausea and vomiting.  Blurred vision.  Dizziness.  Involuntary eye movement (nystagmus). Symptoms can be mild and cause only slight annoyance, or they can be severe and interfere with daily life. Episodes of benign positional vertigo may return (recur) over time, and they may be triggered by certain movements. Symptoms may improve over time. DIAGNOSIS This condition is usually diagnosed by medical history and a physical exam of the head, neck, and ears. You may be referred to a health care provider who specializes in ear, nose, and throat (ENT) problems (otolaryngologist) or a provider who specializes in disorders of the nervous system (neurologist). You may have additional testing, including:  MRI.  A CT scan.  Eye movement tests. Your  health care provider may ask you to change positions quickly while he or she watches you for symptoms of benign positional vertigo, such as nystagmus. Eye movement may be tested with an electronystagmogram (ENG), caloric stimulation, the Dix-Hallpike test, or the roll test.  An electroencephalogram (EEG). This records electrical activity in your brain.  Hearing tests. TREATMENT Usually, your health care provider will treat this by moving your head in specific positions to adjust your inner ear back to normal. Surgery may be needed in severe cases, but this is rare. In some cases, benign positional vertigo may resolve on its own in 2-4 weeks. HOME CARE INSTRUCTIONS Safety  Move slowly.Avoid sudden body or head movements.  Avoid driving.  Avoid operating heavy machinery.  Avoid doing any tasks that would be dangerous to you or others if a vertigo episode would occur.  If you have trouble walking or keeping your balance, try using a cane for stability. If you feel dizzy or unstable, sit down right away.  Return to your normal activities as told by your health care provider. Ask your health care provider what activities are safe for you. General Instructions  Take over-the-counter and prescription medicines only as told by your health care provider.  Avoid certain positions or movements as told by your health care provider.  Drink enough fluid to keep your urine clear or pale yellow.  Keep all follow-up visits as told by your health care provider. This is important. SEEK MEDICAL CARE IF:  You have a fever.  Your condition gets worse or you develop new symptoms.  Your family or friends   notice any behavioral changes.  Your nausea or vomiting gets worse.  You have numbness or a "pins and needles" sensation. SEEK IMMEDIATE MEDICAL CARE IF:  You have difficulty speaking or moving.  You are always dizzy.  You faint.  You develop severe headaches.  You have weakness in your  legs or arms.  You have changes in your hearing or vision.  You develop a stiff neck.  You develop sensitivity to light.   This information is not intended to replace advice given to you by your health care provider. Make sure you discuss any questions you have with your health care provider.   Document Released: 05/28/2006 Document Revised: 05/11/2015 Document Reviewed: 12/13/2014 Elsevier Interactive Patient Education 2016 [employer] Inc[employer]  

## 2016-07-12 NOTE — Evaluation (Signed)
Physical Therapy Evaluation Patient Details Name: Christina Dyer MRN: [id number] DOB: Mar 20, 1961 Today's Date: 07/12/2016   History of Present Illness  55 y.o. female with medical history significant of HTN, HLD who presents to the ED with acute onset vertigo that started upon awakening on the morning of admission. Patient was in her usual state of health the night prior. This morning, patient reported sensation of room spinning around with resultant nausea and vomiting. Symptoms improved while holding head still, worsen with turning her head. Patient was unable to hold any food down. Patient subsequent presented to the emergency department. Patient denies fevers, chills. No sick contacts.  MRI (-), Dx: BPPV.   Clinical Impression  Pt received in bed, and was agreeable to PT evaluation.  Pt expressed that she is normally independent with all functional mobility, ADL's, and IADL's.  She expressed that this morning around 4am, she turned to her left side in the bed, and became dizzy with a sensation that the room was spinning.  Vertebral artery test performed and was (-), therefore Harland Dingwall was performed with head rotated to the left.  No nystagmus noted, and pt did not c/o any dizziness.  This was followed with the Epley maneuver - assisting pt onto her L side, and then sitting up on the EOB.  Pt expressed no dizziness throughout, and did not demonstrate any nystagmus.  Afterwards, pt expressed that she did not have any dizzy sensation.  Educated pt on maintaining [place] >30* for the next 3 days for improved results of tx today.  She ambulated 267ft independently.  Pt does not need any f/u PT at this time.      Follow Up Recommendations No PT follow up    Equipment Recommendations  None recommended by PT    Recommendations for Other Services       Precautions / Restrictions Precautions Precautions: Fall Precaution Comments: 4-5 months ago during the summer, she was pulling a box out of the  bed of a pick up truck.  She was up in the bed of the truck when she fell backwards out of the truck.  States she did not hit her head.  Restrictions Weight Bearing Restrictions: No      Mobility  Bed Mobility Overal bed mobility: Independent             General bed mobility comments: Harland Dingwall and Epley maneuver performed to the left.  No nystagmus noted and pt did not c/o of any dizziness during tx.  Pt then performed all bed level mobility including rolling side to side without any dizziness or room spinning sensation.    Transfers Overall transfer level: Independent                  Ambulation/Gait Ambulation/Gait assistance: Independent Ambulation Distance (Feet): 200 Feet Assistive device: None Gait Pattern/deviations: WFL(Within Functional Limits)     General Gait Details: Pt expressed there is some slight unsteady feeling, however she is not dizzy, and the room is not spinning.    Stairs            Wheelchair Mobility    Modified Rankin (Stroke Patients Only)       Balance Overall balance assessment: Independent;History of Falls  Pertinent Vitals/Pain Pain Assessment: No/denies pain    Home Living   Living Arrangements: Spouse/significant other   Type of Home: House Home Access: Stairs to enter   [employer] of Steps: 3 Home Layout: One level Home Equipment: None      Prior Function Level of Independence: Independent               Hand Dominance        Extremity/Trunk Assessment   Upper Extremity Assessment: Overall WFL for tasks assessed           Lower Extremity Assessment: Overall WFL for tasks assessed         Communication      Cognition Arousal/Alertness: Awake/alert Behavior During Therapy: WFL for tasks assessed/performed Overall Cognitive Status: Within Functional Limits for tasks assessed                       General Comments      Exercises     Assessment/Plan    PT Assessment Patent does not need any further PT services  PT Problem List            PT Treatment Interventions      PT Goals (Current goals can be found in the Care Plan section)  Acute Rehab PT Goals Patient Stated Goal: To stop the dizziness.  PT Goal Formulation: All assessment and education complete, DC therapy    Frequency     Barriers to discharge        Co-evaluation               End of Session   Activity Tolerance: Patient tolerated treatment well Patient left: in chair;with call bell/phone within reach;with family/visitor present           Time: 1435-1510 PT Time Calculation (min) (ACUTE ONLY): 35 min   Charges:   PT Evaluation $PT Eval Low Complexity: 1 Procedure PT Treatments $Canalith Rep Proc: 8-22 mins   PT G Codes:        Beth Waqas Bruhl, PT, DPT X: [phone]

## 2016-07-12 NOTE — ED Notes (Signed)
hospitalist in with pt

## 2016-07-12 NOTE — ED Notes (Signed)
hepatrin not given. Per pyxis not due until 2 pm

## 2016-07-12 NOTE — ED Triage Notes (Signed)
Pt reports waking up from sleep with the room spinning, experienced nausea and then vomited x 3. Pt reports having vertigo 10 years ago, but no recent episodes besides this one.

## 2016-07-12 NOTE — ED Notes (Signed)
CRITICAL VALUE ALERT  Critical value received:  Potassium 2.7  Date of notification:  07/12/16  Time of notification:  0723  Critical value read back:Yes.    Nurse who received alert:  Norm Salt, RN  MD notified (1st page):  Dr. Leonides Schanz  Time of first page:  [phone]  MD notified (2nd page):  Time of second page:  Responding MD:  Dr. Leonides Schanz  Time MD responded:  [phone]

## 2016-07-12 NOTE — Progress Notes (Signed)
Christina Dyer discharged Home per MD order.  Discharge instructions reviewed and discussed with the patient, all questions and concerns answered. Copy of instructions and scripts given to patient.    Medication List    TAKE these medications   acetaminophen 500 MG tablet Commonly known as:  TYLENOL Take 1,000 mg by mouth every 6 (six) hours as needed.   CENTRUM ADULTS PO Take 1 tablet by mouth daily.   hydrochlorothiazide 25 MG tablet Commonly known as:  HYDRODIURIL Take 25 mg by mouth daily.   losartan 25 MG tablet Commonly known as:  COZAAR Take 25 mg by mouth daily.   meclizine 25 MG tablet Commonly known as:  ANTIVERT Take 1 tablet (25 mg total) by mouth 3 (three) times daily as needed for dizziness.   rosuvastatin 10 MG tablet Commonly known as:  CRESTOR Take 20 mg by mouth daily.       Patients skin is clean, dry and intact, no evidence of skin break down. IV site discontinued and catheter remains intact. Site without signs and symptoms of complications. Dressing and pressure applied.  Patient escorted to car by RN in a wheelchair,  no distress noted upon discharge.  Christina Dyer 07/12/2016 6:53 PM

## 2016-07-12 NOTE — Discharge Summary (Signed)
Physician Discharge Summary  Christina Dyer [id number] DOB: December 12, 1960 DOA: 07/12/2016  PCP: Rosita Fire, MD  Admit date: 07/12/2016 Discharge date: 07/12/2016  Admitted From: Home Disposition:  Home  Recommendations for Outpatient Follow-up:  1. Follow up with PCP in 2-3 weeks 2. Recommend repeat BMET in 1 week    Discharge Condition:Improved CODE STATUS:Full Diet recommendation: Regular   Brief/Interim Summary: 55 y.o. female with medical history significant of HTN, HLD who presents to the ED with acute onset vertigo that started upon awakening on the morning of admission. Patient was in her usual state of health the night prior. This morning, patient reported sensation of room spinning around with resultant nausea and vomiting. Symptoms improved while holding head still, worsen with turning her head. Patient was unable to hold any food down. Patient subsequent presented to the emergency department. Patient denies fevers, chills. No sick contacts.  1. Benign paroxysmal positional vertigo 1. Symptoms highly suggestive of benign vertigo  2. MRI brain was negative 3. Initially placed on observation status 4. Consulted physical therapy 5. By end of day, patient's symptoms had largely resolved and patient was able to ambulate in hallway 2. Hypertension 1. Blood pressure initially suboptimally controlled 2. Patient to continue home regimen on discharge 3. Hyperlipidemia 1. Patient to resume home meds on discharge 4. Intractable nausea vomiting 1. Secondary to presenting vertigo 2. Continued on meclizine and when necessary antiemetics while admitted 3. Continued on basal IV fluids 4. Resolved by time of discharge 5. Hypokalemia 1. Likely secondary to resulting nausea vomiting 2. 20 mEq given in the emergency department 3. Patient was given 40 more equivalents for total of 60 4. Recommend repeat basic metabolic panel in 1 week  Discharge Diagnoses:  Principal Problem:    Benign paroxysmal positional vertigo Active Problems:   Essential hypertension   Vertigo   Hypokalemia    Discharge Instructions     Medication List    TAKE these medications   acetaminophen 500 MG tablet Commonly known as:  TYLENOL Take 1,000 mg by mouth every 6 (six) hours as needed.   CENTRUM ADULTS PO Take 1 tablet by mouth daily.   hydrochlorothiazide 25 MG tablet Commonly known as:  HYDRODIURIL Take 25 mg by mouth daily.   losartan 25 MG tablet Commonly known as:  COZAAR Take 25 mg by mouth daily.   meclizine 25 MG tablet Commonly known as:  ANTIVERT Take 1 tablet (25 mg total) by mouth 3 (three) times daily as needed for dizziness.   rosuvastatin 10 MG tablet Commonly known as:  CRESTOR Take 20 mg by mouth daily.      Follow-up Information    FANTA,TESFAYE, MD. Schedule an appointment as soon as possible for a visit in 2 week(s).   Specialty:  Internal Medicine Contact information: [place] [address] [phone]          Allergies  Allergen Reactions  . Codeine Other (See Comments)    Patient states that she passes out  . Meperidine Hcl Other (See Comments)    Patient states that she passes out    Procedures/Studies: Mr Brain Wo Contrast  Result Date: 07/12/2016 CLINICAL DATA:  Vertigo and weakness over the last day.  Nausea up. EXAM: MRI HEAD WITHOUT CONTRAST TECHNIQUE: Multiplanar, multiecho pulse sequences of the brain and surrounding structures were obtained without intravenous contrast. COMPARISON:  03/31/2004 FINDINGS: Brain: The study shows some motion degradation. Diffusion imaging does not show any acute or subacute infarction. The brain looks  normal without evidence of atrophy, old or acute small or large vessel infarction, mass lesion, hemorrhage, hydrocephalus or extra-axial collection. Vascular: Major vessels at the base of the brain show flow. Skull and upper cervical spine: Normal. Hypercellular marrow  pattern probably secondary to the history of anemia. Sinuses/Orbits: Clear/normal. Some artifact in the left maxillary sinus region. Other: None significant IMPRESSION: Motion degraded but apparently normal examination. No cause of vertigo or weakness identified. Electronically Signed   By: Nelson Chimes M.D.   On: 07/12/2016 08:28    Subjective: Feels much better  Discharge Exam: Vitals:   07/12/16 1047 07/12/16 1316  BP: 133/73 126/76  Pulse: 80 83  Resp:  20  Temp: 98.3 F (36.8 C) 98.4 F (36.9 C)   Vitals:   07/12/16 0825 07/12/16 0905 07/12/16 1047 07/12/16 1316  BP: 156/94 137/76 133/73 126/76  Pulse:  95 80 83  Resp:  17  20  Temp:   98.3 F (36.8 C) 98.4 F (36.9 C)  TempSrc:   Oral Oral  SpO2:  100% 100% 100%  Weight:    74.2 kg (163 lb 9.6 oz)  Height:   5\' 4"  (1.626 m)     General: Pt is alert, awake, not in acute distress Cardiovascular: RRR, S1/S2 +, no rubs, no gallops Respiratory: CTA bilaterally, no wheezing, no rhonchi Abdominal: Soft, NT, ND, bowel sounds + Extremities: no edema, no cyanosis   The results of significant diagnostics from this hospitalization (including imaging, microbiology, ancillary and laboratory) are listed below for reference.     Microbiology: No results found for this or any previous visit (from the past 240 hour(s)).   Labs: BNP (last 3 results) No results for input(s): BNP in the last 8760 hours. Basic Metabolic Panel:  Recent Labs Lab 07/12/16 0606 07/12/16 0915  NA 139  --   K 2.7*  --   CL 103  --   CO2 26  --   GLUCOSE 197*  --   BUN 10  --   CREATININE 0.67 0.56  CALCIUM 9.8  --    Liver Function Tests: No results for input(s): AST, ALT, ALKPHOS, BILITOT, PROT, ALBUMIN in the last 168 hours. No results for input(s): LIPASE, AMYLASE in the last 168 hours. No results for input(s): AMMONIA in the last 168 hours. CBC:  Recent Labs Lab 07/12/16 0606 07/12/16 0915  WBC 8.8 12.0*  HGB 12.8 12.0  HCT 37.2  35.2*  MCV 85.7 86.3  PLT 283 255   Cardiac Enzymes: No results for input(s): CKTOTAL, CKMB, CKMBINDEX, TROPONINI in the last 168 hours. BNP: Invalid input(s): POCBNP CBG: No results for input(s): GLUCAP in the last 168 hours. D-Dimer No results for input(s): DDIMER in the last 72 hours. Hgb A1c No results for input(s): HGBA1C in the last 72 hours. Lipid Profile No results for input(s): CHOL, HDL, LDLCALC, TRIG, CHOLHDL, LDLDIRECT in the last 72 hours. Thyroid function studies No results for input(s): TSH, T4TOTAL, T3FREE, THYROIDAB in the last 72 hours.  Invalid input(s): FREET3 Anemia work up No results for input(s): VITAMINB12, FOLATE, FERRITIN, TIBC, IRON, RETICCTPCT in the last 72 hours. Urinalysis    Component Value Date/Time   COLORURINE YELLOW 02/13/2016 0906   APPEARANCEUR CLEAR 02/13/2016 0906   LABSPEC 1.025 02/13/2016 0906   PHURINE 5.5 02/13/2016 0906   GLUCOSEU NEGATIVE 02/13/2016 0906   HGBUR LARGE (A) 02/13/2016 0906   BILIRUBINUR NEGATIVE 02/13/2016 0906   KETONESUR NEGATIVE 02/13/2016 0906   PROTEINUR TRACE (A) 02/13/2016 0906  UROBILINOGEN 0.2 02/28/2009 0929   NITRITE NEGATIVE 02/13/2016 0906   LEUKOCYTESUR NEGATIVE 02/13/2016 0906   Sepsis Labs Invalid input(s): PROCALCITONIN,  WBC,  LACTICIDVEN Microbiology No results found for this or any previous visit (from the past 240 hour(s)).   SIGNED:   Donne Hazel, MD  [facility] 07/12/2016, 4:11 PM  If 7PM-7AM, please contact night-coverage [email or web address] Password [email or web address]

## 2016-07-12 NOTE — ED Notes (Signed)
Pt vomited approx 20 ml of yellow emesis. edp aware.

## 2016-07-12 NOTE — ED Provider Notes (Addendum)
TIME SEEN: 5:55 AM  CHIEF COMPLAINT: Vertigo, nausea, vomiting  HPI: Pt is a 55 y.o. female with history of hypertension, hyperlipidemia, vertigo who presents to the emergency department with vertigo that started this morning when she rolled over in bed to her left side. Describes it as a room spinning feeling that is worse with turning her head to the left. Better with staying still. No headache or head injury. No numbness, tingling or focal weakness. No vision or hearing changes. No ear pain, tinnitus. Patient went to sleep around 9 PM last night feeling well. Woke up around 5 AM symptomatic.  PCP - Dr. Frederico Hamman  ROS: See HPI Constitutional: no fever  Eyes: no drainage  ENT: no runny nose   Cardiovascular:  no chest pain  Resp: no SOB  GI: no vomiting GU: no dysuria Integumentary: no rash  Allergy: no hives  Musculoskeletal: no leg swelling  Neurological: no slurred speech ROS otherwise negative  PAST MEDICAL HISTORY/PAST SURGICAL HISTORY:  Past Medical History:  Diagnosis Date  . Abnormal glucose   . Anemia   . High cholesterol   . Hypertension     MEDICATIONS:  Prior to Admission medications   Medication Sig Start Date End Date Taking? Authorizing Provider  acetaminophen (TYLENOL) 500 MG tablet Take 1,000 mg by mouth every 6 (six) hours as needed.    Historical Provider, MD  esomeprazole (NEXIUM) 40 MG capsule Take 40 mg by mouth daily at 12 noon.    Historical Provider, MD  hydrochlorothiazide (HYDRODIURIL) 25 MG tablet Take 25 mg by mouth daily.    Historical Provider, MD  losartan (COZAAR) 25 MG tablet Take 25 mg by mouth daily.    Historical Provider, MD  Multiple Vitamins-Minerals (CENTRUM ADULTS PO) Take 1 tablet by mouth daily.    Historical Provider, MD  rosuvastatin (CRESTOR) 10 MG tablet Take 10 mg by mouth daily.    Historical Provider, MD    ALLERGIES:  Allergies  Allergen Reactions  . Codeine Other (See Comments)    Patient states that she passes out  .  Meperidine Hcl Other (See Comments)    Patient states that she passes out    SOCIAL HISTORY:  Social History  Substance Use Topics  . Smoking status: Never Smoker  . Smokeless tobacco: Never Used  . Alcohol use No    FAMILY HISTORY: No family history on file.  EXAM: BP 118/76   Pulse 78   Temp 97.7 F (36.5 C)   Resp 17   Ht 5\' 4"  (1.626 m)   Wt 160 lb (72.6 kg)   SpO2 100%   BMI 27.46 kg/m  CONSTITUTIONAL: Alert and oriented and responds appropriately to questions. Well-appearing; well-nourished HEAD: Normocephalic EYES: Conjunctivae clear, PERRL, EOMI, Patient does have fatigable horizontal nystagmus ENT: normal nose; no rhinorrhea; moist mucous membranes; TMs are clear bilaterally without erythema, purulence, bulging, perforation, effusion.  No cerumen impaction or sign of foreign body in the external auditory canal. No inflammation, erythema or drainage from the external auditory canal. No signs of mastoiditis. No pain with manipulation of the pinna bilaterally. NECK: Supple, no meningismus, no nuchal rigidity, no LAD  CARD: RRR; S1 and S2 appreciated; no murmurs, no clicks, no rubs, no gallops RESP: Normal chest excursion without splinting or tachypnea; breath sounds clear and equal bilaterally; no wheezes, no rhonchi, no rales, no hypoxia or respiratory distress, speaking full sentences ABD/GI: Normal bowel sounds; non-distended; soft, non-tender, no rebound, no guarding, no peritoneal signs, no hepatosplenomegaly BACK:  The back appears normal and is non-tender to palpation, there is no CVA tenderness EXT: Normal ROM in all joints; non-tender to palpation; no edema; normal capillary refill; no cyanosis, no calf tenderness or swelling   SKIN: Normal color for age and race; warm; no rash NEURO: Moves all extremities equally, sensation to light touch intact diffusely, cranial nerves II through XII intact, normal speech PSYCH: The patient's mood and manner are appropriate.  Grooming and personal hygiene are appropriate.  MEDICAL DECISION MAKING: Patient here with vertigo. Seems to be peripheral in nature. Doubt that this is a CVA. She would be outside TPA window because last normal was when she went to bed last night over 8 hours ago. Will treat symptomatically with IV fluids, Zofran, Valium and reassess. Otherwise neurologically intact. Labs pending to rule out any other organic cause of her symptoms. I doubt that this is a stroke given symptoms resolved when she is staying still and worse when she turns to the left. She has had peripheral vertigo before. States this feels similar.  ED PROGRESS: 7:25 AM  Patient's potassium is 2.7. This appears to be a chronic problem for patient. She is on hydrochlorothiazide. We'll give IV and oral replacement and obtain an EKG.     7:30 AM  Pt reports some improvement in symptoms but after minimal movement began vomiting again. Will give another dose of IV Valium, Zofran and when she is able to tolerate it give her oral meclizine.  Given she is still so symptomatic, will obtain an MRI of her brain to rule out stroke given she does have risk factors for CVA. She has been updated with this plan.   7:45 AM  Pt's EKG shows no interval abnormality, no ischemia. If patient has had a stroke or we are unable to improve her symptoms to the point where she can walk without assistance and tolerate liquids, I feel she will need admission. Her PCP is Dr. Frederico Hamman.   8:45 AM  Pt's MRI is negative for acute stroke. She is still symptomatic, vomiting with minimal movement. We'll give IV Phenergan. She has had 2 rounds of IV Zofran, IV Valium and oral meclizine. She agrees to admission. Discuss with Dr. Wyline Copas with hospital service who agrees on admission to medical, observation bed. I placed holding orders per his request. Care transferred to hospitalist service. Patient and family have been updated.   I reviewed all nursing notes, vitals, pertinent  old records, EKGs, labs, imaging (as available).   EKG Interpretation  Date/Time:  Thursday July 12 2016 07:37:30 EST Ventricular Rate:  80 PR Interval:    QRS Duration: 98 QT Interval:  419 QTC Calculation: 484 R Axis:   -28 Text Interpretation:  Sinus rhythm Prolonged PR interval Inferior infarct, old No significant change since last tracing Confirmed by WARD,  DO, KRISTEN ([id number]) on 07/12/2016 7:42:05 AM         [place], DO 07/12/16 [place], DO 07/12/16 [id number]

## 2016-07-12 NOTE — H&P (Signed)
History and Physical    ROSELY KALLA [id number] DOB: Jul 04, 1961 DOA: 07/12/2016  [id number] FANTA,TESFAYE, MD , Now Dr. Frederico Hamman Patient coming from: Home  Chief Complaint: Dizziness  HPI: DOLOREZ FRAGALE is a 55 y.o. female with medical history significant of HTN, HLD who presents to the ED with acute onset vertigo that started upon awakening on the morning of admission. Patient was in her usual state of health the night prior. This morning, patient reported sensation of room spinning around with resultant nausea and vomiting. Symptoms improved while holding head still, worsen with turning her head. Patient was unable to hold any food down. Patient subsequent presented to the emergency department. Patient denies fevers, chills. No sick contacts.  ED Course: In emergency department, patient was again unable to tolerate any kind of oral intake. Patient was given multiple doses of Valium as well as meclizine with little to no improvement. Patient subsequent underwent MRI of the brain which had no acute findings. Patient was started on IV fluids. Hospitalist service consulted for consideration for admission  Review of Systems:  Review of Systems  Constitutional: Negative for chills and fever.  HENT: Negative for ear pain.   Eyes: Negative for photophobia and pain.  Respiratory: Negative for hemoptysis, shortness of breath, wheezing and stridor.   Cardiovascular: Negative for palpitations and orthopnea.  Gastrointestinal: Positive for nausea and vomiting. Negative for blood in stool.  Genitourinary: Negative for frequency and hematuria.  Musculoskeletal: Negative for back pain, joint pain and neck pain.  Skin: Negative for itching and rash.  Neurological: Positive for dizziness and weakness. Negative for tingling, seizures and loss of consciousness.  Psychiatric/Behavioral: Negative for hallucinations and memory loss.    Past Medical History:  Diagnosis Date  . Abnormal glucose     . Anemia   . High cholesterol   . Hypertension     Past Surgical History:  Procedure Laterality Date  . CESAREAN SECTION       reports that she has never smoked. She has never used smokeless tobacco. She reports that she does not drink alcohol or use drugs.  Allergies  Allergen Reactions  . Codeine Other (See Comments)    Patient states that she passes out  . Meperidine Hcl Other (See Comments)    Patient states that she passes out    Family history: Mother with diabetes, rheumatoid arthritis, hypertension, hyperlipidemia Father with hypertension Sister with colon polyps Sister with hypertension  Prior to Admission medications   Medication Sig Start Date End Date Taking? Authorizing Provider  acetaminophen (TYLENOL) 500 MG tablet Take 1,000 mg by mouth every 6 (six) hours as needed.   Yes Historical Provider, MD  hydrochlorothiazide (HYDRODIURIL) 25 MG tablet Take 25 mg by mouth daily.   Yes Historical Provider, MD  losartan (COZAAR) 25 MG tablet Take 25 mg by mouth daily.   Yes Historical Provider, MD  Multiple Vitamins-Minerals (CENTRUM ADULTS PO) Take 1 tablet by mouth daily.   Yes Historical Provider, MD  rosuvastatin (CRESTOR) 10 MG tablet Take 20 mg by mouth daily.    Yes Historical Provider, MD    Physical Exam: Vitals:   07/12/16 0740 07/12/16 0745 07/12/16 0825 07/12/16 0905  BP:   156/94 137/76  Pulse: 86 84  95  Resp: 13 16  17   Temp:      TempSrc:      SpO2: 100% 98%  100%  Weight:      Height:        Constitutional: NAD,  calm, comfortable Vitals:   07/12/16 0740 07/12/16 0745 07/12/16 0825 07/12/16 0905  BP:   156/94 137/76  Pulse: 86 84  95  Resp: 13 16  17   Temp:      TempSrc:      SpO2: 100% 98%  100%  Weight:      Height:       Eyes: PERRL, lids and conjunctivae normal ENMT: Mucous membranes are moist. Posterior pharynx clear of any exudate or lesions.Normal dentition.  Neck: normal, supple, no masses, no thyromegaly Respiratory: clear  to auscultation bilaterally, no wheezing, no crackles. Normal respiratory effort. No accessory muscle use.  Cardiovascular: Regular rate and rhythm,S1-S2 Abdomen: no tenderness, no masses palpated. No hepatosplenomegaly. Bowel sounds positive.  Musculoskeletal: no clubbing / cyanosis. No joint deformity upper and lower extremities. Good ROM, no contractures. Normal muscle tone.  Skin: no rashes, lesions Neurologic: CN 2-12 grossly intact. Sensation intact, DTR normal. Strength 5/5 in all 4.  Psychiatric: Normal judgment and insight. Alert and oriented x 3. Normal mood.    Labs on Admission: I have personally reviewed following labs and imaging studies  CBC:  Recent Labs Lab 07/12/16 0606 07/12/16 0915  WBC 8.8 12.0*  HGB 12.8 12.0  HCT 37.2 35.2*  MCV 85.7 86.3  PLT 283 [id number]   Basic Metabolic Panel:  Recent Labs Lab 07/12/16 0606  NA 139  K 2.7*  CL 103  CO2 26  GLUCOSE 197*  BUN 10  CREATININE 0.67  CALCIUM 9.8   GFR: Estimated Creatinine Clearance: 76.7 mL/min (by C-G formula based on SCr of 0.67 mg/dL). Liver Function Tests: No results for input(s): AST, ALT, ALKPHOS, BILITOT, PROT, ALBUMIN in the last 168 hours. No results for input(s): LIPASE, AMYLASE in the last 168 hours. No results for input(s): AMMONIA in the last 168 hours. Coagulation Profile: No results for input(s): INR, PROTIME in the last 168 hours. Cardiac Enzymes: No results for input(s): CKTOTAL, CKMB, CKMBINDEX, TROPONINI in the last 168 hours. BNP (last 3 results) No results for input(s): PROBNP in the last 8760 hours. HbA1C: No results for input(s): HGBA1C in the last 72 hours. CBG: No results for input(s): GLUCAP in the last 168 hours. Lipid Profile: No results for input(s): CHOL, HDL, LDLCALC, TRIG, CHOLHDL, LDLDIRECT in the last 72 hours. Thyroid Function Tests: No results for input(s): TSH, T4TOTAL, FREET4, T3FREE, THYROIDAB in the last 72 hours. Anemia Panel: No results for input(s):  VITAMINB12, FOLATE, FERRITIN, TIBC, IRON, RETICCTPCT in the last 72 hours. Urine analysis:    Component Value Date/Time   COLORURINE YELLOW 02/13/2016 0906   APPEARANCEUR CLEAR 02/13/2016 0906   LABSPEC 1.025 02/13/2016 0906   PHURINE 5.5 02/13/2016 0906   GLUCOSEU NEGATIVE 02/13/2016 0906   HGBUR LARGE (A) 02/13/2016 0906   BILIRUBINUR NEGATIVE 02/13/2016 0906   KETONESUR NEGATIVE 02/13/2016 0906   PROTEINUR TRACE (A) 02/13/2016 0906   UROBILINOGEN 0.2 02/28/2009 0929   NITRITE NEGATIVE 02/13/2016 0906   LEUKOCYTESUR NEGATIVE 02/13/2016 0906   Sepsis Labs: !!!!!!!!!!!!!!!!!!!!!!!!!!!!!!!!!!!!!!!!!!!! @LABRCNTIP (procalcitonin:4,lacticidven:4) )No results found for this or any previous visit (from the past 240 hour(s)).   Radiological Exams on Admission:  MRI was reviewed  Mr Brain Wo Contrast  Result Date: 07/12/2016 CLINICAL DATA:  Vertigo and weakness over the last day.  Nausea up. EXAM: MRI HEAD WITHOUT CONTRAST TECHNIQUE: Multiplanar, multiecho pulse sequences of the brain and surrounding structures were obtained without intravenous contrast. COMPARISON:  03/31/2004 FINDINGS: Brain: The study shows some motion degradation. Diffusion imaging does not show  any acute or subacute infarction. The brain looks normal without evidence of atrophy, old or acute small or large vessel infarction, mass lesion, hemorrhage, hydrocephalus or extra-axial collection. Vascular: Major vessels at the base of the brain show flow. Skull and upper cervical spine: Normal. Hypercellular marrow pattern probably secondary to the history of anemia. Sinuses/Orbits: Clear/normal. Some artifact in the left maxillary sinus region. Other: None significant IMPRESSION: Motion degraded but apparently normal examination. No cause of vertigo or weakness identified. Electronically Signed   By: Nelson Chimes M.D.   On: 07/12/2016 08:28    EKG: Independently reviewed. Normal sinus rhythm  Assessment/Plan Principal  Problem:   Benign paroxysmal positional vertigo Active Problems:   Essential hypertension   Vertigo   Hypokalemia   1. Benign paroxysmal positional vertigo 1. Symptoms highly suggestive of benign vertigo  2. MRI brain negative 3. We'll continue meclizine for symptom relief continue antiemetics as needed 4. We'll place on observation status 5. Consult physical therapy 2. Hypertension 1. Blood pressure currently suboptimally controlled 2. As patient is unable to tolerate much by mouth, we'll transition to IV ACE inhibitor for now 3. We'll order when necessary hydralazine for stock blood pressure greater than 160 3. Hyperlipidemia 1. Plan to resume statin when reliably able to tolerate by mouth 4. Intractable nausea vomiting 1. Secondary to presenting vertigo 2. We'll continue meclizine and when necessary antiemetics 3. We'll continue on basal IV fluids 4. We'll attempt clear liquid diet, advance as tolerated 5. Hypokalemia 1. Likely secondary to resulting nausea vomiting 2. Labs reviewed. 3. 20 mEq given in the emergency department 4. We'll order 40 more equivalents for total of 60 5. Repeat basic metabolic panel morning  DVT prophylaxis: Heparin subcutaneous  Code Status: Full code Family Communication: Patient in room, family at bedside  Disposition Plan: Possible discharge home in 24-48 hours  Consults called:  Admission status: Observation status   Alan Riles, Orpah Melter MD [facility] Pager [phone]  If 7PM-7AM, please contact night-coverage [email or web address] Password [email or web address]  07/12/2016, 9:39 AM

## 2016-08-02 ENCOUNTER — Ambulatory Visit
Admission: RE | Admit: 2016-08-02 | Payer: BLUE CROSS/BLUE SHIELD | Source: Ambulatory Visit | Admitting: Gastroenterology

## 2016-08-02 ENCOUNTER — Encounter: Admission: RE | Payer: Self-pay | Source: Ambulatory Visit

## 2016-08-02 SURGERY — COLONOSCOPY WITH PROPOFOL
Anesthesia: General

## 2016-12-19 ENCOUNTER — Emergency Department (HOSPITAL_COMMUNITY): Payer: BLUE CROSS/BLUE SHIELD

## 2016-12-19 ENCOUNTER — Emergency Department (HOSPITAL_COMMUNITY)
Admission: EM | Admit: 2016-12-19 | Discharge: 2016-12-19 | Disposition: A | Discharge status: Home / Self Care | Payer: BLUE CROSS/BLUE SHIELD | Attending: Emergency Medicine | Admitting: Emergency Medicine

## 2016-12-19 ENCOUNTER — Encounter (HOSPITAL_COMMUNITY): Payer: Self-pay | Admitting: *Deleted

## 2016-12-19 DIAGNOSIS — Z79899 Other long term (current) drug therapy: Secondary | ICD-10-CM | POA: Diagnosis not present

## 2016-12-19 DIAGNOSIS — R1084 Generalized abdominal pain: Secondary | ICD-10-CM | POA: Diagnosis present

## 2016-12-19 DIAGNOSIS — R197 Diarrhea, unspecified: Secondary | ICD-10-CM | POA: Insufficient documentation

## 2016-12-19 DIAGNOSIS — I10 Essential (primary) hypertension: Secondary | ICD-10-CM | POA: Insufficient documentation

## 2016-12-19 DIAGNOSIS — R110 Nausea: Secondary | ICD-10-CM | POA: Diagnosis not present

## 2016-12-19 HISTORY — DX: Gastro-esophageal reflux disease without esophagitis: K21.9

## 2016-12-19 LAB — URINALYSIS, ROUTINE W REFLEX MICROSCOPIC
Bilirubin Urine: NEGATIVE
GLUCOSE, UA: NEGATIVE mg/dL
KETONES UR: NEGATIVE mg/dL
LEUKOCYTES UA: NEGATIVE
NITRITE: NEGATIVE
PH: 6 (ref 5.0–8.0)
PROTEIN: NEGATIVE mg/dL
Specific Gravity, Urine: 1.011 (ref 1.005–1.030)

## 2016-12-19 LAB — COMPREHENSIVE METABOLIC PANEL
ALK PHOS: 65 U/L (ref 38–126)
ALT: 19 U/L (ref 14–54)
AST: 18 U/L (ref 15–41)
Albumin: 4.7 g/dL (ref 3.5–5.0)
Anion gap: 9 (ref 5–15)
BILIRUBIN TOTAL: 0.6 mg/dL (ref 0.3–1.2)
BUN: 7 mg/dL (ref 6–20)
CALCIUM: 10.1 mg/dL (ref 8.9–10.3)
CO2: 31 mmol/L (ref 22–32)
Chloride: 100 mmol/L — ABNORMAL LOW (ref 101–111)
Creatinine, Ser: 0.59 mg/dL (ref 0.44–1.00)
Glucose, Bld: 115 mg/dL — ABNORMAL HIGH (ref 65–99)
POTASSIUM: 3.2 mmol/L — AB (ref 3.5–5.1)
Sodium: 140 mmol/L (ref 135–145)
TOTAL PROTEIN: 7.6 g/dL (ref 6.5–8.1)

## 2016-12-19 LAB — DIFFERENTIAL
Basophils Absolute: 0 10*3/uL (ref 0.0–0.1)
Basophils Relative: 0 %
EOS PCT: 0 %
Eosinophils Absolute: 0 10*3/uL (ref 0.0–0.7)
LYMPHS PCT: 20 %
Lymphs Abs: 1.8 10*3/uL (ref 0.7–4.0)
MONO ABS: 0.5 10*3/uL (ref 0.1–1.0)
MONOS PCT: 5 %
NEUTROS ABS: 6.9 10*3/uL (ref 1.7–7.7)
Neutrophils Relative %: 75 %

## 2016-12-19 LAB — CBC
HCT: 40 % (ref 36.0–46.0)
Hemoglobin: 13.7 g/dL (ref 12.0–15.0)
MCH: 29.5 pg (ref 26.0–34.0)
MCHC: 34.3 g/dL (ref 30.0–36.0)
MCV: 86.2 fL (ref 78.0–100.0)
Platelets: 330 10*3/uL (ref 150–400)
RBC: 4.64 MIL/uL (ref 3.87–5.11)
RDW: 13.1 % (ref 11.5–15.5)
WBC: 9.4 10*3/uL (ref 4.0–10.5)

## 2016-12-19 LAB — LIPASE, BLOOD: Lipase: 13 U/L (ref 11–51)

## 2016-12-19 MED ORDER — POTASSIUM CHLORIDE CRYS ER 20 MEQ PO TBCR
40.0000 meq | EXTENDED_RELEASE_TABLET | Freq: Once | ORAL | Status: AC
  Administered 2016-12-19: 40 meq via ORAL
  Filled 2016-12-19: qty 2

## 2016-12-19 MED ORDER — IOPAMIDOL (ISOVUE-300) INJECTION 61%
100.0000 mL | Freq: Once | INTRAVENOUS | Status: AC | PRN
  Administered 2016-12-19: 100 mL via INTRAVENOUS

## 2016-12-19 MED ORDER — DICYCLOMINE HCL 10 MG/ML IM SOLN
20.0000 mg | Freq: Once | INTRAMUSCULAR | Status: AC
  Administered 2016-12-19: 20 mg via INTRAMUSCULAR
  Filled 2016-12-19: qty 2

## 2016-12-19 MED ORDER — FAMOTIDINE IN NACL 20-0.9 MG/50ML-% IV SOLN
20.0000 mg | Freq: Once | INTRAVENOUS | Status: AC
  Administered 2016-12-19: 20 mg via INTRAVENOUS
  Filled 2016-12-19: qty 50

## 2016-12-19 MED ORDER — DICYCLOMINE HCL 20 MG PO TABS: 20.0000 mg | ORAL_TABLET | Freq: Four times a day (QID) | ORAL | 0 refills | Status: DC | PRN

## 2016-12-19 MED ORDER — ONDANSETRON HCL 4 MG/2ML IJ SOLN
4.0000 mg | INTRAMUSCULAR | Status: DC | PRN
  Administered 2016-12-19: 4 mg via INTRAVENOUS
  Filled 2016-12-19: qty 2

## 2016-12-19 MED ORDER — ONDANSETRON HCL 4 MG PO TABS: 4.0000 mg | ORAL_TABLET | Freq: Three times a day (TID) | ORAL | 0 refills | Status: DC | PRN

## 2016-12-19 NOTE — ED Provider Notes (Signed)
[facility] DEPT Provider Note   CSN: [id number] Arrival date & time: 12/19/16  4680     History   Chief Complaint Chief Complaint  Patient presents with  . Abdominal Pain    HPI Christina Dyer is a 56 y.o. female.  HPI  Pt was seen at 1030.  Per pt, c/o gradual onset and persistence of constant generalized abd "pain" for the past 3 days.  Has been associated with nausea and multiple intermittent episodes of diarrhea.  Describes the abd pain as "cramping."  Denies vomiting, no fevers, no back pain, no rash, no CP/SOB, no black or blood in stools.      Past Medical History:  Diagnosis Date  . Abnormal glucose   . Anemia   . GERD (gastroesophageal reflux disease)   . High cholesterol   . Hypertension     Patient Active Problem List   Diagnosis Date Noted  . Vertigo 07/12/2016  . BPPV (benign paroxysmal positional vertigo), left 07/12/2016  . Hypokalemia 07/12/2016  . Intractable vomiting with nausea   . HLD (hyperlipidemia) 09/23/2007  . VAGINITIS 09/23/2007  . PERIMENOPAUSAL STATUS 09/23/2007  . Essential hypertension 09/23/2007    Past Surgical History:  Procedure Laterality Date  . CESAREAN SECTION      OB History    No data available       Home Medications    Prior to Admission medications   Medication Sig Start Date End Date Taking? Authorizing Provider  hydrochlorothiazide (HYDRODIURIL) 25 MG tablet Take 25 mg by mouth daily.   Yes Historical Provider, MD  losartan (COZAAR) 25 MG tablet Take 25 mg by mouth daily.   Yes Historical Provider, MD  Multiple Vitamins-Minerals (CENTRUM ADULTS PO) Take 1 tablet by mouth daily.   Yes Historical Provider, MD  potassium chloride (K-DUR) 10 MEQ tablet Take 10 mEq by mouth daily.   Yes Historical Provider, MD  rosuvastatin (CRESTOR) 20 MG tablet Take 1 tablet by mouth every evening. 11/22/16  Yes Historical Provider, MD  acetaminophen (TYLENOL) 500 MG tablet Take 1,000 mg by mouth every 6 (six) hours as  needed.    Historical Provider, MD  meclizine (ANTIVERT) 25 MG tablet Take 1 tablet (25 mg total) by mouth 3 (three) times daily as needed for dizziness. Patient not taking: Reported on 12/19/2016 07/12/16   Donne Hazel, MD  omeprazole (PRILOSEC) 20 MG capsule Take 1 capsule by mouth daily. 11/28/16   Historical Provider, MD    Family History No family history on file.  Social History Social History  Substance Use Topics  . Smoking status: Never Smoker  . Smokeless tobacco: Never Used  . Alcohol use No     Allergies   Codeine and Meperidine hcl   Review of Systems Review of Systems ROS: Statement: All systems negative except as marked or noted in the HPI; Constitutional: Negative for fever and chills. ; ; Eyes: Negative for eye pain, redness and discharge. ; ; ENMT: Negative for ear pain, hoarseness, nasal congestion, sinus pressure and sore throat. ; ; Cardiovascular: Negative for chest pain, palpitations, diaphoresis, dyspnea and peripheral edema. ; ; Respiratory: Negative for cough, wheezing and stridor. ; ; Gastrointestinal: +nausea, diarrhea, abd pain. Negative for vomiting, blood in stool, hematemesis, jaundice and rectal bleeding. . ; ; Genitourinary: Negative for dysuria, flank pain and hematuria. ; ; Musculoskeletal: Negative for back pain and neck pain. Negative for swelling and trauma.; ; Skin: Negative for pruritus, rash, abrasions, blisters, bruising and skin lesion.; ;  Neuro: Negative for headache, lightheadedness and neck stiffness. Negative for weakness, altered level of consciousness, altered mental status, extremity weakness, paresthesias, involuntary movement, seizure and syncope.       Physical Exam Updated Vital Signs BP (!) 157/93 (BP Location: Right Arm)   Pulse 76   Temp 98.7 F (37.1 C) (Oral)   Resp 14   SpO2 100%    BP 125/81 (BP Location: Right Arm)   Pulse 88   Temp 98.7 F (37.1 C) (Oral)   Resp 18   SpO2 98%    Physical  Exam 1035: Physical examination:  Nursing notes reviewed; Vital signs and O2 SAT reviewed;  Constitutional: Well developed, Well nourished, Well hydrated, In no acute distress; Head:  Normocephalic, atraumatic; Eyes: EOMI, PERRL, No scleral icterus; ENMT: Mouth and pharynx normal, Mucous membranes moist; Neck: Supple, Full range of motion, No lymphadenopathy; Cardiovascular: Regular rate and rhythm, No gallop; Respiratory: Breath sounds clear & equal bilaterally, No wheezes.  Speaking full sentences with ease, Normal respiratory effort/excursion; Chest: Nontender, Movement normal; Abdomen: Soft, +mild diffuse tenderness to palp. No rebound or guarding. Nondistended, Normal bowel sounds; Genitourinary: No CVA tenderness; Extremities: Pulses normal, No tenderness, No edema, No calf edema or asymmetry.; Neuro: AA&Ox3, Major CN grossly intact.  Speech clear. No gross focal motor or sensory deficits in extremities.; Skin: Color normal, Warm, Dry.   ED Treatments / Results  Labs (all labs ordered are listed, but only abnormal results are displayed)   EKG  EKG Interpretation None       Radiology   Procedures Procedures (including critical care time)  Medications Ordered in ED Medications  ondansetron (ZOFRAN) injection 4 mg (4 mg Intravenous Given 12/19/16 1103)  famotidine (PEPCID) IVPB 20 mg premix (20 mg Intravenous New Bag/Given 12/19/16 1104)  dicyclomine (BENTYL) injection 20 mg (20 mg Intramuscular Given 12/19/16 1105)  iopamidol (ISOVUE-300) 61 % injection 100 mL (100 mLs Intravenous Contrast Given 12/19/16 1153)     Initial Impression / Assessment and Plan / ED Course  I have reviewed the triage vital signs and the nursing notes.  Pertinent labs & imaging results that were available during my care of the patient were reviewed by me and considered in my medical decision making (see chart for details).  MDM Reviewed: previous chart, nursing note and vitals Reviewed previous:  labs Interpretation: labs, x-ray and CT scan    Results for orders placed or performed during the hospital encounter of 12/19/16  Lipase, blood  Result Value Ref Range   Lipase 13 11 - 51 U/L  Comprehensive metabolic panel  Result Value Ref Range   Sodium 140 135 - 145 mmol/L   Potassium 3.2 (L) 3.5 - 5.1 mmol/L   Chloride 100 (L) 101 - 111 mmol/L   CO2 31 22 - 32 mmol/L   Glucose, Bld 115 (H) 65 - 99 mg/dL   BUN 7 6 - 20 mg/dL   Creatinine, Ser 0.59 0.44 - 1.00 mg/dL   Calcium 10.1 8.9 - 10.3 mg/dL   Total Protein 7.6 6.5 - 8.1 g/dL   Albumin 4.7 3.5 - 5.0 g/dL   AST 18 15 - 41 U/L   ALT 19 14 - 54 U/L   Alkaline Phosphatase 65 38 - 126 U/L   Total Bilirubin 0.6 0.3 - 1.2 mg/dL   GFR calc non Af Amer >60 >60 mL/min   GFR calc Af Amer >60 >60 mL/min   Anion gap 9 5 - 15  CBC  Result Value Ref  Range   WBC 9.4 4.0 - 10.5 K/uL   RBC 4.64 3.87 - 5.11 MIL/uL   Hemoglobin 13.7 12.0 - 15.0 g/dL   HCT 40.0 36.0 - 46.0 %   MCV 86.2 78.0 - 100.0 fL   MCH 29.5 26.0 - 34.0 pg   MCHC 34.3 30.0 - 36.0 g/dL   RDW 13.1 11.5 - 15.5 %   Platelets 330 150 - 400 K/uL  Urinalysis, Routine w reflex microscopic  Result Value Ref Range   Color, Urine YELLOW YELLOW   APPearance CLEAR CLEAR   Specific Gravity, Urine 1.011 1.005 - 1.030   pH 6.0 5.0 - 8.0   Glucose, UA NEGATIVE NEGATIVE mg/dL   Hgb urine dipstick LARGE (A) NEGATIVE   Bilirubin Urine NEGATIVE NEGATIVE   Ketones, ur NEGATIVE NEGATIVE mg/dL   Protein, ur NEGATIVE NEGATIVE mg/dL   Nitrite NEGATIVE NEGATIVE   Leukocytes, UA NEGATIVE NEGATIVE   RBC / HPF TOO NUMEROUS TO COUNT 0 - 5 RBC/hpf   WBC, UA 0-5 0 - 5 WBC/hpf   Bacteria, UA RARE (A) NONE SEEN   Squamous Epithelial / LPF 0-5 (A) NONE SEEN   Mucous PRESENT    Crystals PRESENT (A) NEGATIVE  Differential  Result Value Ref Range   Neutrophils Relative % 75 %   Neutro Abs 6.9 1.7 - 7.7 K/uL   Lymphocytes Relative 20 %   Lymphs Abs 1.8 0.7 - 4.0 K/uL   Monocytes  Relative 5 %   Monocytes Absolute 0.5 0.1 - 1.0 K/uL   Eosinophils Relative 0 %   Eosinophils Absolute 0.0 0.0 - 0.7 K/uL   Basophils Relative 0 %   Basophils Absolute 0.0 0.0 - 0.1 K/uL   Dg Chest 2 View Result Date: 12/19/2016 CLINICAL DATA:  Diarrhea, cramping, chills  starting Sunday evening EXAM: CHEST  2 VIEW COMPARISON:  02/13/2016 FINDINGS: Cardiomediastinal silhouette is stable. No infiltrate or pleural effusion. No pulmonary edema. Mild degenerative changes mid thoracic spine. IMPRESSION: No active cardiopulmonary disease. Electronically Signed   By: Lahoma Crocker M.D.   On: 12/19/2016 11:55   Ct Abdomen Pelvis W Contrast Result Date: 12/19/2016 CLINICAL DATA:  56 year old female with history of abdominal pain, diarrhea and nausea for the past 3 days. EXAM: CT ABDOMEN AND PELVIS WITH CONTRAST TECHNIQUE: Multidetector CT imaging of the abdomen and pelvis was performed using the standard protocol following bolus administration of intravenous contrast. CONTRAST:  17mL ISOVUE-300 IOPAMIDOL (ISOVUE-300) INJECTION 61% COMPARISON:  No priors. FINDINGS: Lower chest: Unremarkable. Hepatobiliary: No cystic or solid hepatic lesions. No intra or extrahepatic biliary ductal dilatation. Gallbladder is normal in appearance. Pancreas: No pancreatic mass. No pancreatic ductal dilatation. No pancreatic or peripancreatic fluid or inflammatory changes. Spleen: Unremarkable.  Small splenule inferior to the spleen. Adrenals/Urinary Tract: A few scattered tiny subcentimeter low-attenuation lesions are noted in the kidneys bilaterally, too small to characterize, but statistically likely to represent tiny cysts. No hydroureteronephrosis. Urinary bladder is normal. Bilateral adrenal glands are normal in appearance. Stomach/Bowel: The appearance of the stomach is normal. There is no pathologic dilatation of small bowel or colon. Normal appendix. Vascular/Lymphatic: Aortic atherosclerosis (mild), without evidence of aneurysm  or dissection noted in the abdominal or pelvic vasculature. No lymphadenopathy noted in the abdomen or pelvis. Reproductive: Uterus and ovaries are unremarkable in appearance. Other: No significant volume of ascites.  No pneumoperitoneum. Musculoskeletal: There are no aggressive appearing lytic or blastic lesions noted in the visualized portions of the skeleton. IMPRESSION: 1. No acute findings are noted  in the abdomen or pelvis to account for the patient's symptoms. 2. Normal appendix. 3. Aortic atherosclerosis (mild). Electronically Signed   By: Vinnie Langton M.D.   On: 12/19/2016 12:35    1345:  Potassium repleted PO.  Pt has tol PO well while in the ED without N/V.  No stooling while in the ED.  Abd benign, VSS. Feels better and wants to go home now. Dx and testing d/w pt.  Questions answered.  Verb understanding, agreeable to d/c home with outpt f/u.    Final Clinical Impressions(s) / ED Diagnoses   Final diagnoses:  None    New Prescriptions New Prescriptions   No medications on file     Francine Graven, DO 12/23/16 1627

## 2016-12-19 NOTE — Discharge Instructions (Signed)
Take the prescriptions as directed.  Increase your fluid intake (ie:  Gatoraide) for the next few days.  Eat a bland diet and advance to your regular diet slowly as you can tolerate it.   Avoid full strength juices, as well as milk and milk products until your diarrhea has resolved.   Call your regular medical doctor today to schedule a follow up appointment in the next 2 days.  Return to the Emergency Department immediately if not improving (or even worsening) despite taking the medicines as prescribed, any black or bloody stool or vomit, if you develop a fever over "101," or for any other concerns. ° °

## 2016-12-19 NOTE — ED Triage Notes (Signed)
Pt comes in with abdominal pain accompanied by diarrhea and nausea starting on Sunday. Denies any new use of antibiotics. NAD noted.

## 2017-04-09 ENCOUNTER — Ambulatory Visit (INDEPENDENT_AMBULATORY_CARE_PROVIDER_SITE_OTHER): Payer: BLUE CROSS/BLUE SHIELD | Admitting: Gastroenterology

## 2017-04-09 ENCOUNTER — Other Ambulatory Visit: Payer: Self-pay

## 2017-04-09 ENCOUNTER — Ambulatory Visit: Payer: BLUE CROSS/BLUE SHIELD | Admitting: Nurse Practitioner

## 2017-04-09 ENCOUNTER — Encounter: Payer: Self-pay | Admitting: Gastroenterology

## 2017-04-09 VITALS — BP 141/84 | HR 86 | Temp 99.4°F | Ht 63.0 in | Wt 158.0 lb

## 2017-04-09 DIAGNOSIS — R1310 Dysphagia, unspecified: Secondary | ICD-10-CM | POA: Diagnosis not present

## 2017-04-09 DIAGNOSIS — Z8371 Family history of colonic polyps: Secondary | ICD-10-CM

## 2017-04-09 DIAGNOSIS — K219 Gastro-esophageal reflux disease without esophagitis: Secondary | ICD-10-CM | POA: Diagnosis not present

## 2017-04-09 DIAGNOSIS — Z1211 Encounter for screening for malignant neoplasm of colon: Secondary | ICD-10-CM

## 2017-04-09 MED ORDER — CLENPIQ 10-3.5-12 MG-GM -GM/160ML PO SOLN: 1.0000 | Freq: Once | ORAL | 0 refills | Status: AC

## 2017-04-09 MED ORDER — PANTOPRAZOLE SODIUM 40 MG PO TBEC: 40.0000 mg | DELAYED_RELEASE_TABLET | Freq: Every day | ORAL | 3 refills | Status: DC

## 2017-04-09 NOTE — Patient Instructions (Addendum)
Start taking Protonix once each morning, 30 minutes before breakfast.   We have scheduled you for a colonoscopy, upper endoscopy, and dilation with Dr. Oneida Alar in the near future.   Food Choices for Gastroesophageal Reflux Disease, Adult When you have gastroesophageal reflux disease (GERD), the foods you eat and your eating habits are very important. Choosing the right foods can help ease your discomfort. What guidelines do I need to follow?  Choose fruits, vegetables, whole grains, and low-fat dairy products.  Choose low-fat meat, fish, and poultry.  Limit fats such as oils, salad dressings, butter, nuts, and avocado.  Keep a food diary. This helps you identify foods that cause symptoms.  Avoid foods that cause symptoms. These may be different for everyone.  Eat small meals often instead of 3 large meals a day.  Eat your meals slowly, in a place where you are relaxed.  Limit fried foods.  Cook foods using methods other than frying.  Avoid drinking alcohol.  Avoid drinking large amounts of liquids with your meals.  Avoid bending over or lying down until 2-3 hours after eating. What foods are not recommended? These are some foods and drinks that may make your symptoms worse: Vegetables Tomatoes. Tomato juice. Tomato and spaghetti sauce. Chili peppers. Onion and garlic. Horseradish. Fruits Oranges, grapefruit, and lemon (fruit and juice). Meats High-fat meats, fish, and poultry. This includes hot dogs, ribs, ham, sausage, salami, and bacon. Dairy Whole milk and chocolate milk. Sour cream. Cream. Butter. Ice cream. Cream cheese. Drinks Coffee and tea. Bubbly (carbonated) drinks or energy drinks. Condiments Hot sauce. Barbecue sauce. Sweets/Desserts Chocolate and cocoa. Donuts. Peppermint and spearmint. Fats and Oils High-fat foods. This includes [place] fries and potato chips. Other Vinegar. Strong spices. This includes black pepper, white pepper, red pepper, cayenne,  curry powder, cloves, ginger, and chili powder. The items listed above may not be a complete list of foods and drinks to avoid. Contact your dietitian for more information. This information is not intended to replace advice given to you by your health care provider. Make sure you discuss any questions you have with your health care provider. Document Released: 02/19/2012 Document Revised: 01/26/2016 Document Reviewed: 06/24/2013 Elsevier Interactive Patient Education  2017 [employer].

## 2017-04-09 NOTE — Progress Notes (Addendum)
REVIEWED-NO ADDITIONAL RECOMMENDATIONS.  Primary Care Physician:  System, Pcp Not In Primary Gastroenterologist:  Dr. Oneida Alar   Chief Complaint  Patient presents with  . Gastroesophageal Reflux  . Colonoscopy    HPI:   Christina Dyer is a 56 y.o. female presenting today at the request of her PCP due to GERD and need for screening colonoscopy. Appears she was seen at [facility] Aug 2017 and was to have colonoscopy/EGD. This was not completed.   States she had a colonoscopy about 25 years ago. No polyps. No prior EGD. States she has a lot of problems with GERD. When eating, food gets hung up in her throat. Nocturnal reflux. Omeprazole without help. Has done Nexium as well OTC. Throat feels sore. Constantly clearing her throat. Unsure if she has taken Protonix. Taking Rolaids. Has not received any GERD diets in the past.   States about a year ago was treated for H.pylori after serology was positive. Improvement with abdominal discomfort after that. Rare abdominal discomfort. No rectal bleeding. No constipation or diarrhea. Rare NSAIDs.   Past Medical History:  Diagnosis Date  . Abnormal glucose   . Anemia   . GERD (gastroesophageal reflux disease)   . High cholesterol   . Hypertension   . Vertigo     Past Surgical History:  Procedure Laterality Date  . CESAREAN SECTION     X 2  . COLONOSCOPY     about 25 years ago    Current Outpatient Prescriptions  Medication Sig Dispense Refill  . acetaminophen (TYLENOL) 500 MG tablet Take 1,000 mg by mouth every 6 (six) hours as needed.    . hydrochlorothiazide (HYDRODIURIL) 25 MG tablet Take 25 mg by mouth daily.    Marland Kitchen losartan (COZAAR) 25 MG tablet Take 25 mg by mouth daily.    . Multiple Vitamins-Minerals (CENTRUM ADULTS PO) Take 1 tablet by mouth daily.    . potassium chloride (K-DUR) 10 MEQ tablet Take 10 mEq by mouth daily.    . rosuvastatin (CRESTOR) 20 MG tablet Take 1 tablet by mouth every evening.  3  .  pantoprazole (PROTONIX) 40 MG tablet Take 1 tablet (40 mg total) by mouth daily. Take 30 minutes before breakfast. 30 tablet 3   No current facility-administered medications for this visit.     Allergies as of 04/09/2017 - Review Complete 04/09/2017  Allergen Reaction Noted  . Codeine Other (See Comments) 08/29/2012  . Meperidine hcl Other (See Comments) 09/23/2007    Family History  Problem Relation Age of Onset  . Colon polyps Mother        52s  . Colon polyps Sister   . Colon cancer Neg Hx     Social History   Social History  . Marital status: Married    Spouse name: N/A  . Number of children: N/A  . Years of education: N/A   Occupational History  . Not on file.   Social History Main Topics  . Smoking status: Never Smoker  . Smokeless tobacco: Never Used  . Alcohol use No  . Drug use: No  . Sexual activity: Yes    Birth control/ protection: None   Other Topics Concern  . Not on file   Social History Narrative  . No narrative on file    Review of Systems: As mentioned in HPI   Physical Exam: BP (!) 141/84   Pulse 86   Temp 99.4 F (37.4 C) (Oral)   Ht 5\' 3"  (1.6 m)  Wt 158 lb (71.7 kg)   BMI 27.99 kg/m  General:   Alert and oriented. Pleasant and cooperative. Well-nourished and well-developed.  Head:  Normocephalic and atraumatic. Eyes:  Without icterus, sclera clear and conjunctiva pink.  Ears:  Normal auditory acuity. Nose:  No deformity, discharge,  or lesions. Mouth:  No deformity or lesions, oral mucosa pink.  Lungs:  Clear to auscultation bilaterally.  Heart:  S1, S2 present without murmurs appreciated.  Abdomen:  +BS, soft, non-tender and non-distended. No HSM noted. No guarding or rebound. No masses appreciated.  Rectal:  Deferred  Msk:  Symmetrical without gross deformities. Normal posture. Extremities:  Without edema. Neurologic:  Alert and  oriented x4 Psych:  Alert and cooperative. Normal mood and affect.

## 2017-04-12 NOTE — Assessment & Plan Note (Signed)
56 year old female with need for routine screening colonoscopy, last at least 25 years ago and reports no personal history of polyps. Family history of polyps (mother in her 11s, sister). No concerning lower GI symptoms. No FH colorectal cancer.   Proceed with colonoscopy with Dr. Oneida Alar in the near future. The risks, benefits, and alternatives have been discussed in detail with the patient. They state understanding and desire to proceed.

## 2017-04-12 NOTE — Assessment & Plan Note (Addendum)
Chronic GERD, nocturnal reflux, no improvement with Prilosec currently or Nexium historically. Soreness in throat. Query element of LPR. Dysphagia with solid food in setting of uncontrolled GERD. Will trial Protonix once daily and arrange for EGD in near future with dilation as appropriate.  Proceed with upper endoscopy/dilation in the near future with Dr. Oneida Alar. The risks, benefits, and alternatives have been discussed in detail with patient. They have stated understanding and desire to proceed.  Start Protonix once daily.  GERD diet provided.

## 2017-04-15 NOTE — Progress Notes (Signed)
cc'ed to pcp °

## 2017-04-24 ENCOUNTER — Telehealth: Payer: Self-pay

## 2017-04-24 NOTE — Telephone Encounter (Signed)
Pt's insurance would not cover the prep so she will come by to pick up a sample Clenpiq.

## 2017-05-27 ENCOUNTER — Encounter (HOSPITAL_COMMUNITY): Payer: Self-pay | Admitting: *Deleted

## 2017-05-27 ENCOUNTER — Emergency Department (HOSPITAL_COMMUNITY)
Admission: EM | Admit: 2017-05-27 | Discharge: 2017-05-27 | Disposition: A | Discharge status: Home / Self Care | Payer: BLUE CROSS/BLUE SHIELD | Source: Home / Self Care | Attending: Emergency Medicine | Admitting: Emergency Medicine

## 2017-05-27 ENCOUNTER — Ambulatory Visit (HOSPITAL_COMMUNITY)
Admission: RE | Admit: 2017-05-27 | Discharge: 2017-05-27 | Disposition: A | Discharge status: Home / Self Care | Payer: BLUE CROSS/BLUE SHIELD | Source: Ambulatory Visit | Attending: Gastroenterology | Admitting: Gastroenterology

## 2017-05-27 ENCOUNTER — Other Ambulatory Visit: Payer: Self-pay

## 2017-05-27 ENCOUNTER — Encounter (HOSPITAL_COMMUNITY)
Admission: RE | Disposition: A | Discharge status: Home / Self Care | Payer: Self-pay | Source: Ambulatory Visit | Attending: Gastroenterology

## 2017-05-27 DIAGNOSIS — E860 Dehydration: Secondary | ICD-10-CM | POA: Insufficient documentation

## 2017-05-27 DIAGNOSIS — I10 Essential (primary) hypertension: Secondary | ICD-10-CM | POA: Insufficient documentation

## 2017-05-27 DIAGNOSIS — K317 Polyp of stomach and duodenum: Secondary | ICD-10-CM | POA: Diagnosis not present

## 2017-05-27 DIAGNOSIS — Z79899 Other long term (current) drug therapy: Secondary | ICD-10-CM | POA: Insufficient documentation

## 2017-05-27 DIAGNOSIS — R1310 Dysphagia, unspecified: Secondary | ICD-10-CM

## 2017-05-27 DIAGNOSIS — E876 Hypokalemia: Secondary | ICD-10-CM | POA: Insufficient documentation

## 2017-05-27 DIAGNOSIS — K2950 Unspecified chronic gastritis without bleeding: Secondary | ICD-10-CM | POA: Diagnosis not present

## 2017-05-27 DIAGNOSIS — E7800 Pure hypercholesterolemia, unspecified: Secondary | ICD-10-CM | POA: Insufficient documentation

## 2017-05-27 DIAGNOSIS — R55 Syncope and collapse: Secondary | ICD-10-CM

## 2017-05-27 DIAGNOSIS — K621 Rectal polyp: Secondary | ICD-10-CM | POA: Diagnosis not present

## 2017-05-27 DIAGNOSIS — K648 Other hemorrhoids: Secondary | ICD-10-CM | POA: Diagnosis not present

## 2017-05-27 DIAGNOSIS — Z1211 Encounter for screening for malignant neoplasm of colon: Secondary | ICD-10-CM | POA: Diagnosis present

## 2017-05-27 DIAGNOSIS — Z7982 Long term (current) use of aspirin: Secondary | ICD-10-CM

## 2017-05-27 DIAGNOSIS — Z8371 Family history of colonic polyps: Secondary | ICD-10-CM | POA: Diagnosis not present

## 2017-05-27 DIAGNOSIS — D127 Benign neoplasm of rectosigmoid junction: Secondary | ICD-10-CM | POA: Insufficient documentation

## 2017-05-27 DIAGNOSIS — D125 Benign neoplasm of sigmoid colon: Secondary | ICD-10-CM | POA: Insufficient documentation

## 2017-05-27 DIAGNOSIS — K222 Esophageal obstruction: Secondary | ICD-10-CM

## 2017-05-27 DIAGNOSIS — K219 Gastro-esophageal reflux disease without esophagitis: Secondary | ICD-10-CM | POA: Diagnosis not present

## 2017-05-27 HISTORY — PX: BIOPSY: SHX5522

## 2017-05-27 HISTORY — PX: COLONOSCOPY: SHX5424

## 2017-05-27 HISTORY — PX: ESOPHAGOGASTRODUODENOSCOPY: SHX5428

## 2017-05-27 HISTORY — PX: POLYPECTOMY: SHX5525

## 2017-05-27 HISTORY — PX: SAVORY DILATION: SHX5439

## 2017-05-27 LAB — CBC
HEMATOCRIT: 34.4 % — AB (ref 36.0–46.0)
Hemoglobin: 12 g/dL (ref 12.0–15.0)
MCH: 29.8 pg (ref 26.0–34.0)
MCHC: 34.9 g/dL (ref 30.0–36.0)
MCV: 85.4 fL (ref 78.0–100.0)
Platelets: 237 10*3/uL (ref 150–400)
RBC: 4.03 MIL/uL (ref 3.87–5.11)
RDW: 12.8 % (ref 11.5–15.5)
WBC: 11.3 10*3/uL — AB (ref 4.0–10.5)

## 2017-05-27 LAB — BASIC METABOLIC PANEL
Anion gap: 8 (ref 5–15)
BUN: 10 mg/dL (ref 6–20)
CHLORIDE: 104 mmol/L (ref 101–111)
CO2: 29 mmol/L (ref 22–32)
Calcium: 9 mg/dL (ref 8.9–10.3)
Creatinine, Ser: 0.67 mg/dL (ref 0.44–1.00)
GFR calc Af Amer: >60 mL/min (ref >60)
GFR calc non Af Amer: >60 mL/min (ref >60)
Glucose, Bld: 120 mg/dL — ABNORMAL HIGH (ref 65–99)
POTASSIUM: 3 mmol/L — AB (ref 3.5–5.1)
Sodium: 141 mmol/L (ref 135–145)

## 2017-05-27 SURGERY — COLONOSCOPY
Anesthesia: Moderate Sedation

## 2017-05-27 MED ORDER — POTASSIUM CHLORIDE CRYS ER 20 MEQ PO TBCR
40.0000 meq | EXTENDED_RELEASE_TABLET | Freq: Once | ORAL | Status: AC
Start: 2017-05-27 — End: 2017-05-27
  Administered 2017-05-27: 40 meq via ORAL
  Filled 2017-05-27: qty 2

## 2017-05-27 MED ORDER — SODIUM CHLORIDE 0.9 % IV BOLUS (SEPSIS)
1000.0000 mL | Freq: Once | INTRAVENOUS | Status: AC
  Administered 2017-05-27: 1000 mL via INTRAVENOUS

## 2017-05-27 MED ORDER — POTASSIUM CHLORIDE ER 20 MEQ PO TBCR: 20.0000 meq | EXTENDED_RELEASE_TABLET | Freq: Two times a day (BID) | ORAL | 0 refills | Status: DC

## 2017-05-27 MED ORDER — SODIUM CHLORIDE 0.9 % IV SOLN
INTRAVENOUS | Status: DC
  Administered 2017-05-27: 11:00:00 via INTRAVENOUS

## 2017-05-27 MED ORDER — MINERAL OIL PO OIL
TOPICAL_OIL | ORAL | Status: AC
  Filled 2017-05-27: qty 30

## 2017-05-27 MED ORDER — MIDAZOLAM HCL 5 MG/5ML IJ SOLN
INTRAMUSCULAR | Status: AC
  Filled 2017-05-27: qty 10

## 2017-05-27 MED ORDER — MEPERIDINE HCL 100 MG/ML IJ SOLN: INTRAMUSCULAR | Status: DC | PRN

## 2017-05-27 MED ORDER — LIDOCAINE VISCOUS 2 % MT SOLN
OROMUCOSAL | Status: AC
  Filled 2017-05-27: qty 15

## 2017-05-27 MED ORDER — STERILE WATER FOR IRRIGATION IR SOLN
Status: DC | PRN
  Administered 2017-05-27: 12:00:00

## 2017-05-27 MED ORDER — MIDAZOLAM HCL 5 MG/5ML IJ SOLN
INTRAMUSCULAR | Status: DC | PRN
  Administered 2017-05-27: 2 mg via INTRAVENOUS
  Administered 2017-05-27 (×3): 1 mg via INTRAVENOUS
  Administered 2017-05-27 (×2): 2 mg via INTRAVENOUS
  Administered 2017-05-27: 1 mg via INTRAVENOUS

## 2017-05-27 MED ORDER — FENTANYL CITRATE (PF) 100 MCG/2ML IJ SOLN
INTRAMUSCULAR | Status: DC | PRN
  Administered 2017-05-27 (×4): 25 ug via INTRAVENOUS

## 2017-05-27 MED ORDER — FENTANYL CITRATE (PF) 100 MCG/2ML IJ SOLN
INTRAMUSCULAR | Status: AC
  Filled 2017-05-27: qty 4

## 2017-05-27 MED ORDER — LIDOCAINE VISCOUS 2 % MT SOLN
OROMUCOSAL | Status: DC | PRN
Start: 2017-05-27 — End: 2017-05-27
  Administered 2017-05-27: 4 mL via OROMUCOSAL

## 2017-05-27 NOTE — H&P (Signed)
Primary Care Physician:  System, Pcp Not In Primary Gastroenterologist:  Dr. Oneida Alar  Pre-Procedure History & Physical: HPI:  Christina Dyer is a 56 y.o. female here for DYSPHAGIA/screening.  Past Medical History:  Diagnosis Date  . Abnormal glucose   . Anemia   . GERD (gastroesophageal reflux disease)   . High cholesterol   . Hypertension   . Vertigo     Past Surgical History:  Procedure Laterality Date  . CESAREAN SECTION     X 2  . COLONOSCOPY     about 25 years ago    Prior to Admission medications   Medication Sig Start Date End Date Taking? Authorizing Provider  acetaminophen (TYLENOL) 500 MG tablet Take 1,000 mg by mouth every 6 (six) hours as needed.   Yes [provider]  aspirin EC 81 MG tablet Take 81 mg by mouth daily.   Yes [provider]  hydrochlorothiazide (HYDRODIURIL) 25 MG tablet Take 25 mg by mouth daily.   Yes [provider]  losartan (COZAAR) 25 MG tablet Take 25 mg by mouth daily.   Yes [provider]  Multiple Vitamins-Minerals (CENTRUM ADULTS PO) Take 1 tablet by mouth daily.   Yes [provider]  pantoprazole (PROTONIX) 40 MG tablet Take 1 tablet (40 mg total) by mouth daily. Take 30 minutes before breakfast. 04/09/17  Yes Annitta Needs, NP  potassium chloride 20 MEQ TBCR Take 20 mEq by mouth 2 (two) times daily. 05/27/17  Yes Rolland Porter, MD  rosuvastatin (CRESTOR) 20 MG tablet Take 1 tablet by mouth every evening. 11/22/16  Yes [provider]  potassium chloride (K-DUR) 10 MEQ tablet Take 10 mEq by mouth daily.    [provider]    Allergies as of 04/09/2017 - Review Complete 04/09/2017  Allergen Reaction Noted  . Codeine Other (See Comments) 08/29/2012  . Meperidine hcl Other (See Comments) 09/23/2007    Family History  Problem Relation Age of Onset  . Colon polyps Mother        63s  . Colon polyps Sister   . Colon cancer Neg Hx     Social History   Social History  .  Marital status: Married    Spouse name: N/A  . Number of children: N/A  . Years of education: N/A   Occupational History  . Not on file.   Social History Main Topics  . Smoking status: Never Smoker  . Smokeless tobacco: Never Used  . Alcohol use No  . Drug use: No  . Sexual activity: Yes    Birth control/ protection: None   Other Topics Concern  . Not on file   Social History Narrative  . No narrative on file    Review of Systems: See HPI, otherwise negative ROS   Physical Exam: BP (!) 152/77   Pulse 88   Temp 97.8 F (36.6 C) (Oral)   Resp 15   Ht 5\' 4"  (1.626 m)   Wt 158 lb (71.7 kg)   SpO2 98%   BMI 27.12 kg/m  General:   Alert,  pleasant and cooperative in NAD Head:  Normocephalic and atraumatic. Neck:  Supple; Lungs:  Clear throughout to auscultation.    Heart:  Regular rate and rhythm. Abdomen:  Soft, nontender and nondistended. Normal bowel sounds, without guarding, and without rebound.   Neurologic:  Alert and  oriented x4;  grossly normal neurologically.  Impression/Plan:     DYSPHAGIA/screening  PLAN:  EGD/DIL/tcs TODAY DISCUSSED PROCEDURE, BENEFITS, &  RISKS: < 1% chance of medication reaction, bleeding, perforation, or rupture of spleen/liver.

## 2017-05-27 NOTE — Discharge Instructions (Signed)
You had 3 polyps removed. You have small internal hemorrhoids. YOU HAD A SMALL STOMACH POLYP REMOVED. I STRETCHED YOUR ESOPHAGUS DUE YOUR PROBLEMS SWALLOWING.   HOLD HYDROCHLOROTHIAZIDE ON SEP 25. RE-START ON SEP 26.  TAKE potassium pills 3 TABS WHEN YOU GET HOME AND TAKE 3 AGAIN IN 6 HOURS. TAKE 2 TABS IN THE MORNING SEP 25.   DRINK WATER TO KEEP YOUR URINE LIGHT YELLOW.  FOLLOW A HIGH FIBER DIET. AVOID ITEMS THAT CAUSE BLOATING. SEE INFO BELOW.  YOUR BIOPSY RESULTS WILL BE AVAILABLE IN MY CHART AFTER SEP 27 AND MY OFFICE WILL CONTACT YOU IN 10-14 DAYS WITH YOUR RESULTS.   Next colonoscopy in 5-10 years.   ENDOSCOPY Care After Read the instructions outlined below and refer to this sheet in the next week. These discharge instructions provide you with general information on caring for yourself after you leave the hospital. While your treatment has been planned according to the most current medical practices available, unavoidable complications occasionally occur. If you have any problems or questions after discharge, call DR. Gurnie Duris, [phone].  ACTIVITY  You may resume your regular activity, but move at a slower pace for the next 24 hours.   Take frequent rest periods for the next 24 hours.   Walking will help get rid of the air and reduce the bloated feeling in your belly (abdomen).   No driving for 24 hours (because of the medicine (anesthesia) used during the test).   You may shower.   Do not sign any important legal documents or operate any machinery for 24 hours (because of the anesthesia used during the test).    NUTRITION  Drink plenty of fluids.   You may resume your normal diet as instructed by your doctor.   Begin with a light meal and progress to your normal diet. Heavy or fried foods are harder to digest and may make you feel sick to your stomach (nauseated).   Avoid alcoholic beverages for 24 hours or as instructed.    MEDICATIONS  You may resume your  normal medications.   WHAT YOU CAN EXPECT TODAY  Some feelings of bloating in the abdomen.   Passage of more gas than usual.   Spotting of blood in your stool or on the toilet paper  .  IF YOU HAD POLYPS REMOVED DURING THE ENDOSCOPY:  Eat a soft diet IF YOU HAVE NAUSEA, BLOATING, ABDOMINAL PAIN, OR VOMITING.    FINDING OUT THE RESULTS OF YOUR TEST Not all test results are available during your visit. DR. Oneida Alar WILL CALL YOU WITHIN 14 DAYS OF YOUR PROCEDUE WITH YOUR RESULTS. Do not assume everything is normal if you have not heard from DR. Tejuan Gholson, CALL HER OFFICE AT [phone].  SEEK IMMEDIATE MEDICAL ATTENTION AND CALL THE OFFICE: [phone] IF:  You have more than a spotting of blood in your stool.   Your belly is swollen (abdominal distention).   You are nauseated or vomiting.   You have a temperature over 101F.   You have abdominal pain or discomfort that is severe or gets worse throughout the day.   High-Fiber Diet A high-fiber diet changes your normal diet to include more whole grains, legumes, fruits, and vegetables. Changes in the diet involve replacing refined carbohydrates with unrefined foods. The calorie level of the diet is essentially unchanged. The Dietary Reference Intake (recommended amount) for adult males is 38 grams per day. For adult females, it is 25 grams per day. Pregnant and lactating women should consume  28 grams of fiber per day. Fiber is the intact part of a plant that is not broken down during digestion. Functional fiber is fiber that has been isolated from the plant to provide a beneficial effect in the body. PURPOSE  Increase stool bulk.   Ease and regulate bowel movements.   Lower cholesterol.   REDUCE RISK OF COLON CANCER  INDICATIONS THAT YOU NEED MORE FIBER  Constipation and hemorrhoids.   Uncomplicated diverticulosis (intestine condition) and irritable bowel syndrome.   Weight management.   As a protective measure against  hardening of the arteries (atherosclerosis), diabetes, and cancer.   GUIDELINES FOR INCREASING FIBER IN THE DIET  Start adding fiber to the diet slowly. A gradual increase of about 5 more grams (2 slices of whole-wheat bread, 2 servings of most fruits or vegetables, or 1 bowl of high-fiber cereal) per day is best. Too rapid an increase in fiber may result in constipation, flatulence, and bloating.   Drink enough water and fluids to keep your urine clear or pale yellow. Water, juice, or caffeine-free drinks are recommended. Not drinking enough fluid may cause constipation.   Eat a variety of high-fiber foods rather than one type of fiber.   Try to increase your intake of fiber through using high-fiber foods rather than fiber pills or supplements that contain small amounts of fiber.   The goal is to change the types of food eaten. Do not supplement your present diet with high-fiber foods, but replace foods in your present diet.   INCLUDE A VARIETY OF FIBER SOURCES  Replace refined and processed grains with whole grains, canned fruits with fresh fruits, and incorporate other fiber sources. White rice, white breads, and most bakery goods contain little or no fiber.   Brown whole-grain rice, buckwheat oats, and many fruits and vegetables are all good sources of fiber. These include: broccoli, Brussels sprouts, cabbage, cauliflower, beets, sweet potatoes, white potatoes (skin on), carrots, tomatoes, eggplant, squash, berries, fresh fruits, and dried fruits.   Cereals appear to be the richest source of fiber. Cereal fiber is found in whole grains and bran. Bran is the fiber-rich outer coat of cereal grain, which is largely removed in refining. In whole-grain cereals, the bran remains. In breakfast cereals, the largest amount of fiber is found in those with "bran" in their names. The fiber content is sometimes indicated on the label.   You may need to include additional fruits and vegetables each day.     In baking, for 1 cup white flour, you may use the following substitutions:   1 cup whole-wheat flour minus 2 tablespoons.   1/2 cup white flour plus 1/2 cup whole-wheat flour.    Polyps, Colon  A polyp is extra tissue that grows inside your body. Colon polyps grow in the large intestine. The large intestine, also called the colon, is part of your digestive system. It is a long, hollow tube at the end of your digestive tract where your body makes and stores stool. Most polyps are not dangerous. They are benign. This means they are not cancerous. But over time, some types of polyps can turn into cancer. Polyps that are smaller than a pea are usually not harmful. But larger polyps could someday become or may already be cancerous. To be safe, doctors remove all polyps and test them.   WHO GETS POLYPS? Anyone can get polyps, but certain people are more likely than others. You may have a greater chance of getting polyps if:  You are over 50.   You have had polyps before.   Someone in your family has had polyps.   Someone in your family has had cancer of the large intestine.   Find out if someone in your family has had polyps. You may also be more likely to get polyps if you:   Eat a lot of fatty foods   Smoke   Drink alcohol   Do not exercise  Eat too much   PREVENTION There is not one sure way to prevent polyps. You might be able to lower your risk of getting them if you:  Eat more fruits and vegetables and less fatty food.   Do not smoke.   Avoid alcohol.   Exercise every day.   Lose weight if you are overweight.   Eating more calcium and folate can also lower your risk of getting polyps. Some foods that are rich in calcium are milk, cheese, and broccoli. Some foods that are rich in folate are chickpeas, kidney beans, and spinach.

## 2017-05-27 NOTE — Op Note (Signed)
[facility] Patient Name: Christina Dyer Procedure Date: 05/27/2017 11:13 AM MRN: [id number] Date of Birth: 05/01/1961 Attending MD: Barney Drain MD, MD CSN: [id number] Age: 56 Admit Type: Outpatient Procedure:                Upper GI endoscopy with esophageal dilation/cold                            forceps biopsy Indications:              Dysphagia Providers:                Barney Drain MD, MD, Otis Peak B. Sharon Seller, RN, Rosina Lowenstein, RN Referring MD:              Medicines:                Fentanyl 25 micrograms IV, Midazolam 4 mg IV Complications:            No immediate complications. Estimated Blood Loss:     Estimated blood loss was minimal. Procedure:                Pre-Anesthesia Assessment:                           - Prior to the procedure, a History and Physical                            was performed, and patient medications and                            allergies were reviewed. The patient's tolerance of                            previous anesthesia was also reviewed. The risks                            and benefits of the procedure and the sedation                            options and risks were discussed with the patient.                            All questions were answered, and informed consent                            was obtained. Prior Anticoagulants: The patient has                            taken aspirin, last dose was 7 days prior to                            procedure. ASA Grade Assessment: II - A patient  with mild systemic disease. After reviewing the                            risks and benefits, the patient was deemed in                            satisfactory condition to undergo the procedure.                            After obtaining informed consent, the endoscope was                            passed under direct vision. Throughout the                            procedure, the  patient's blood pressure, pulse, and                            oxygen saturations were monitored continuously. The                            [id number] ([id number]) scope was introduced through the                            mouth, and advanced to the second part of duodenum.                            The upper GI endoscopy was technically difficult                            and complex due to the patient's agitation.                            Successful completion of the procedure was aided by                            increasing the dose of sedation medication. The                            patient tolerated the procedure fairly well. Scope In: 12:08:44 PM Scope Out: 12:19:07 PM Total Procedure Duration: 0 hours 10 minutes 23 seconds  Findings:      A non-obstructing Schatzki ring (acquired) was found at the       gastroesophageal junction. A guidewire was placed and the scope was       withdrawn. Dilation was performed with a Savary dilator with mild       resistance at 15 mm, 16 mm and 17 mm. Estimated blood loss was minimal.      A single 2 mm sessile polyp with no stigmata of recent bleeding was       found in the gastric fundus. Biopsies were taken with a cold forceps for       histology & h pylori..      The examined duodenum was normal. Impression:               -  Non-obstructing Schatzki ring. Dilated.                           - A single gastric polyp. Resected and retrieved.                            Biopsied. Moderate Sedation:      Moderate (conscious) sedation was administered by the endoscopy nurse       and supervised by the endoscopist. The following parameters were       monitored: oxygen saturation, heart rate, blood pressure, and response       to care. Total physician intraservice time was 49 minutes. Recommendation:           - Await pathology results.                           - Return to my office in 3 months.                           - High fiber diet.                            - Continue present medications.                           - Await pathology results.                           - Patient has a contact number available for                            emergencies. The signs and symptoms of potential                            delayed complications were discussed with the                            patient. Return to normal activities tomorrow.                            Written discharge instructions were provided to the                            patient. Procedure Code(s):        --- Professional ---                           [phone], Esophagogastroduodenoscopy, flexible,                            transoral; with insertion of guide wire followed by                            passage of dilator(s) through esophagus over guide  wire                           [id number], Esophagogastroduodenoscopy, flexible,                            transoral; with biopsy, single or multiple                           99152, Moderate sedation services provided by the                            same physician or other qualified health care                            professional performing the diagnostic or                            therapeutic service that the sedation supports,                            requiring the presence of an independent trained                            observer to assist in the monitoring of the                            patient's level of consciousness and physiological                            status; initial 15 minutes of intraservice time,                            patient age 60 years or older                           [phone], Moderate sedation services; each additional                            15 minutes intraservice time                           99153, Moderate sedation services; each additional                            15 minutes intraservice time Diagnosis Code(s):        ---  Professional ---                           K22.2, Esophageal obstruction                           K31.7, Polyp of stomach and duodenum                           R13.10, Dysphagia, unspecified CPT copyright 2016 [employer]. All  rights reserved. The codes documented in this report are preliminary and upon coder review may  be revised to meet current compliance requirements. Barney Drain, MD Barney Drain MD, MD 05/27/2017 12:46:47 PM This report has been signed electronically. Number of Addenda: 0

## 2017-05-27 NOTE — Op Note (Signed)
[facility] Patient Name: Christina Dyer Procedure Date: 05/27/2017 11:20 AM MRN: [id number] Date of Birth: [birth year] Attending MD: Barney Drain MD, MD CSN: [id number] Age: 56 Admit Type: Outpatient Procedure:                Colonoscopy with COLD SNARE & SNARE CAUTERY                            POLYPECTOMY Indications:              Screening for colorectal malignant neoplasm Providers:                Barney Drain MD, MD, Janeece Riggers, RN, Tammy Vaught,                            RN Referring MD:              Medicines:                Fentanyl 75 micrograms IV, Midazolam 6 mg IV Complications:            No immediate complications. Estimated Blood Loss:     Estimated blood loss was minimal. Procedure:                Pre-Anesthesia Assessment:                           - Prior to the procedure, a History and Physical                            was performed, and patient medications and                            allergies were reviewed. The patient's tolerance of                            previous anesthesia was also reviewed. The risks                            and benefits of the procedure and the sedation                            options and risks were discussed with the patient.                            All questions were answered, and informed consent                            was obtained. Prior Anticoagulants: The patient has                            taken aspirin, last dose was 7 days prior to                            procedure. ASA Grade Assessment: II - A patient  with mild systemic disease. After reviewing the                            risks and benefits, the patient was deemed in                            satisfactory condition to undergo the procedure.                            After obtaining informed consent, the colonoscope                            was passed under direct vision. Throughout the   procedure, the patient's blood pressure, pulse, and                            oxygen saturations were monitored continuously. The                            [id number] ([id number]) scope was introduced through                            the anus and advanced to the the cecum, identified                            by appendiceal orifice and ileocecal valve. The                            colonoscopy was technically difficult and complex                            due to significant looping. Successful completion                            of the procedure was aided by increasing the dose                            of sedation medication and COLOWRAP. The patient                            tolerated the procedure fairly well. The quality of                            the bowel preparation was good. The ileocecal                            valve, appendiceal orifice, and rectum were                            photographed. Scope In: 11:39:28 AM Scope Out: 12:02:53 PM Scope Withdrawal Time: 0 hours 17 minutes 36 seconds  Total Procedure Duration: 0 hours 23 minutes 25 seconds  Findings:      Three sessile polyps were found in the rectum and sigmoid colon. The  polyps were 3 to 5 mm in size. These polyps were removed with a cold       snare. Resection and retrieval were complete.      A 8 mm polyp was found in the recto-sigmoid colon. The polyp was       sessile. The polyp was removed with a hot snare. Resection and retrieval       were complete.      The recto-sigmoid colon, sigmoid colon and descending colon revealed       significantly excessive looping.      Internal hemorrhoids were found during retroflexion. The hemorrhoids       were small. Impression:               - Three 3 to 5 mm polyps in the rectum and in the                            sigmoid colon, removed with a cold snare. Resected                            and retrieved.                           - One 8 mm polyp at the  recto-sigmoid colon,                            removed with a hot snare. Resected and retrieved.                           - There was significant looping of the LEFT COLON.                           - Internal hemorrhoids. Moderate Sedation:      Moderate (conscious) sedation was administered by the endoscopy nurse       and supervised by the endoscopist. The following parameters were       monitored: oxygen saturation, heart rate, blood pressure, and response       to care. Total physician intraservice time was 49 minutes. Recommendation:           - Repeat colonoscopy in 3 years for surveillance.                            CONSIDER PROPOFOL.                           - High fiber diet.                           - Continue present medications.                           - Await pathology results.                           - Patient has a contact number available for  emergencies. The signs and symptoms of potential                            delayed complications were discussed with the                            patient. Return to normal activities tomorrow.                            Written discharge instructions were provided to the                            patient. Procedure Code(s):        --- Professional ---                           [phone], Colonoscopy, flexible; with removal of                            tumor(s), polyp(s), or other lesion(s) by snare                            technique                           99152, Moderate sedation services provided by the                            same physician or other qualified health care                            professional performing the diagnostic or                            therapeutic service that the sedation supports,                            requiring the presence of an independent trained                            observer to assist in the monitoring of the                             patient's level of consciousness and physiological                            status; initial 15 minutes of intraservice time,                            patient age 68 years or older                           [phone], Moderate sedation services; each additional  15 minutes intraservice time                           99153, Moderate sedation services; each additional                            15 minutes intraservice time Diagnosis Code(s):        --- Professional ---                           Z12.11, Encounter for screening for malignant                            neoplasm of colon                           K62.1, Rectal polyp                           D12.5, Benign neoplasm of sigmoid colon                           D12.7, Benign neoplasm of rectosigmoid junction                           K64.8, Other hemorrhoids CPT copyright 2016 [employer]. All rights reserved. The codes documented in this report are preliminary and upon coder review may  be revised to meet current compliance requirements. Barney Drain, MD Barney Drain MD, MD 05/27/2017 12:42:47 PM This report has been signed electronically. Number of Addenda: 0

## 2017-05-27 NOTE — Discharge Instructions (Signed)
Take the potassium pills twice a day until gone. Continue your instructions for your endoscopy and colonoscopy later today. Return if you feel worse again.

## 2017-05-27 NOTE — ED Provider Notes (Signed)
[place] DEPT Provider Note   CSN: [id number] Arrival date & time: 05/27/17  0035  Time seen 01:00 AM   History   Chief Complaint Chief Complaint  Patient presents with  . Near Syncope    HPI ISA Christina Dyer is a 56 y.o. female.  HPI  Patient states she started doing a colonoscopy prep at 5 PM. She states she had a bowel movement described as "a lot" at 5 PM and then at 10:30 PM she had a lot of just water coming out. She states she was sitting on the commode and she felt weak and thought she might pass out.she states she laid on the floor. She had some nausea but no vomiting. She's not sure if she was sweaty. She states her hands felt numb and she felt some shortness of breath. She had some abdominal "griping" after the second BM but it is gone now. She denies seeing any blood. She states she's feeling better now. She states she is scheduled to have a endoscopy and colonoscopy at noon on September 24 by Dr. Oneida Alar. This is the first time she's ever had one.  PCP Trish Fountain  Past Medical History:  Diagnosis Date  . Abnormal glucose   . Anemia   . GERD (gastroesophageal reflux disease)   . High cholesterol   . Hypertension   . Vertigo     Patient Active Problem List   Diagnosis Date Noted  . GERD (gastroesophageal reflux disease) 04/09/2017  . Dysphagia 04/09/2017  . FH: colon polyps 04/09/2017  . Vertigo 07/12/2016  . BPPV (benign paroxysmal positional vertigo), left 07/12/2016  . Hypokalemia 07/12/2016  . Intractable vomiting with nausea   . HLD (hyperlipidemia) 09/23/2007  . VAGINITIS 09/23/2007  . PERIMENOPAUSAL STATUS 09/23/2007  . Essential hypertension 09/23/2007    Past Surgical History:  Procedure Laterality Date  . CESAREAN SECTION     X 2  . COLONOSCOPY     about 25 years ago    OB History    No data available       Home Medications    Prior to Admission medications   Medication Sig Start Date End Date Taking? Authorizing Provider    acetaminophen (TYLENOL) 500 MG tablet Take 1,000 mg by mouth every 6 (six) hours as needed.    [provider]  aspirin EC 81 MG tablet Take 81 mg by mouth daily.    [provider]  hydrochlorothiazide (HYDRODIURIL) 25 MG tablet Take 25 mg by mouth daily.    [provider]  losartan (COZAAR) 25 MG tablet Take 25 mg by mouth daily.    [provider]  Multiple Vitamins-Minerals (CENTRUM ADULTS PO) Take 1 tablet by mouth daily.    [provider]  pantoprazole (PROTONIX) 40 MG tablet Take 1 tablet (40 mg total) by mouth daily. Take 30 minutes before breakfast. 04/09/17   Annitta Needs, NP  potassium chloride (K-DUR) 10 MEQ tablet Take 10 mEq by mouth daily.    [provider]  potassium chloride 20 MEQ TBCR Take 20 mEq by mouth 2 (two) times daily. 05/27/17   Rolland Porter, MD  rosuvastatin (CRESTOR) 20 MG tablet Take 1 tablet by mouth every evening. 11/22/16   [provider]    Family History Family History  Problem Relation Age of Onset  . Colon polyps Mother        61s  . Colon polyps Sister   . Colon cancer Neg Hx  Social History Social History  Substance Use Topics  . Smoking status: Never Smoker  . Smokeless tobacco: Never Used  . Alcohol use No  unemployed   Allergies   Codeine and Meperidine hcl   Review of Systems Review of Systems  All other systems reviewed and are negative.    Physical Exam Updated Vital Signs BP (!) 143/69   Pulse 85   Temp (!) 97.5 F (36.4 C) (Oral)   Resp 19   Ht 5\' 4"  (1.626 m)   Wt 71.7 kg (158 lb)   SpO2 100%   BMI 27.12 kg/m   Physical Exam  Constitutional: She is oriented to person, place, and time. She appears well-developed and well-nourished.  Non-toxic appearance. She does not appear ill. No distress.  HENT:  Head: Normocephalic and atraumatic.  Right Ear: External ear normal.  Left Ear: External ear normal.  Nose: Nose normal. No mucosal edema or  rhinorrhea.  Mouth/Throat: Mucous membranes are dry. No dental abscesses or uvula swelling.  Eyes: Pupils are equal, round, and reactive to light. Conjunctivae and EOM are normal.  Neck: Normal range of motion and full passive range of motion without pain. Neck supple.  Cardiovascular: Normal rate, regular rhythm and normal heart sounds.  Exam reveals no gallop and no friction rub.   No murmur heard. Pulmonary/Chest: Effort normal and breath sounds normal. No respiratory distress. She has no wheezes. She has no rhonchi. She has no rales. She exhibits no tenderness and no crepitus.  Abdominal: Soft. Normal appearance and bowel sounds are normal. She exhibits no distension. There is no tenderness. There is no rebound and no guarding.  Musculoskeletal: Normal range of motion. She exhibits no edema or tenderness.  Moves all extremities well.   Neurological: She is alert and oriented to person, place, and time. She has normal strength. No cranial nerve deficit.  Skin: Skin is warm, dry and intact. No rash noted. No erythema. No pallor.  Psychiatric: She has a normal mood and affect. Her speech is normal and behavior is normal. Her mood appears not anxious.  Nursing note and vitals reviewed.  Orthostatic VS for the past 24 hrs:  BP- Lying Pulse- Lying BP- Sitting Pulse- Sitting BP- Standing at 0 minutes Pulse- Standing at 0 minutes  05/27/17 0115 128/88 77 133/78 84 128/82 86    Orthostatic Vital signs normal    ED Treatments / Results  Labs (all labs ordered are listed, but only abnormal results are displayed) Results for orders placed or performed during the hospital encounter of 09/38/18  Basic metabolic panel  Result Value Ref Range   Sodium 141 135 - 145 mmol/L   Potassium 3.0 (L) 3.5 - 5.1 mmol/L   Chloride 104 101 - 111 mmol/L   CO2 29 22 - 32 mmol/L   Glucose, Bld 120 (H) 65 - 99 mg/dL   BUN 10 6 - 20 mg/dL   Creatinine, Ser 0.67 0.44 - 1.00 mg/dL   Calcium 9.0 8.9 - 10.3 mg/dL     GFR calc non Af Amer >60 >60 mL/min   GFR calc Af Amer >60 >60 mL/min   Anion gap 8 5 - 15  CBC  Result Value Ref Range   WBC 11.3 (H) 4.0 - 10.5 K/uL   RBC 4.03 3.87 - 5.11 MIL/uL   Hemoglobin 12.0 12.0 - 15.0 g/dL   HCT 34.4 (L) 36.0 - 46.0 %   MCV 85.4 78.0 - 100.0 fL   MCH 29.8 26.0 - 34.0  pg   MCHC 34.9 30.0 - 36.0 g/dL   RDW 12.8 11.5 - 15.5 %   Platelets 237 150 - 400 K/uL   Laboratory interpretation all normal except leukocytosis, hypokalemia    EKG  EKG Interpretation  Date/Time:  Monday May 27 2017 00:42:22 EDT Ventricular Rate:  76 PR Interval:    QRS Duration: 92 QT Interval:  421 QTC Calculation: 474 R Axis:   -53 Text Interpretation:  Sinus rhythm Prolonged PR interval LAD, consider left anterior fascicular block Borderline low voltage, extremity leads Baseline wander in lead(s) V3 No significant change since last tracing 12 Jul 2016 Confirmed by Rolland Porter [phone]) on 05/27/2017 1:34:19 AM       Radiology No results found.  Procedures Procedures (including critical care time)  Medications Ordered in ED Medications  sodium chloride 0.9 % bolus 1,000 mL (0 mLs Intravenous Stopped 05/27/17 0231)  sodium chloride 0.9 % bolus 1,000 mL (0 mLs Intravenous Stopped 05/27/17 0357)  potassium chloride SA (K-DUR,KLOR-CON) CR tablet 40 mEq (40 mEq Oral Given 05/27/17 0231)     Initial Impression / Assessment and Plan / ED Course  I have reviewed the triage vital signs and the nursing notes.  Pertinent labs & imaging results that were available during my care of the patient were reviewed by me and considered in my medical decision making (see chart for details).    Patient was given IV fluids and laboratory testing was done.  After reviewing her blood work she was given oral potassium.patient states her potassium is always low, when I review her medication list she is on hydrochlorothiazide and we discussed that that is the most likely etiology of her low  potassium. Also tonight it's low because of the amount of diarrhea she had.  Recheck at 3 AM patient states she's feeling better. She still has a liter of IV fluid hanging however she had moved her arm and her IV was running. I showed the patient and her husband how to watch to make sure the IV was running.She states she is starting to have urinary output. Hopefully she will be able to be discharged after this liter of fluid.  4 AM she started initially at liter of fluid and she feels ready to be discharged.she has had no further diarrhea episodes while in the ED.  Final Clinical Impressions(s) / ED Diagnoses   Final diagnoses:  Dehydration  Near syncope  Hypokalemia    New Prescriptions New Prescriptions   POTASSIUM CHLORIDE 20 MEQ TBCR    Take 20 mEq by mouth 2 (two) times daily.    Plan discharge  Rolland Porter, MD, Barbette Or, MD 05/27/17 [phone]

## 2017-05-27 NOTE — ED Triage Notes (Signed)
Pt states that she started her colonoscopy prep tonight, had an episode of feeling "like she was going to pass out" upon ems arrival to pt's residence pt was sitting in the floor, pale, unable to palpate radial pulse, pt given 250 cc NS bolus with ems with improvement of symptoms, upon arrival to er pt alert,able to answer questions, denies any pain.

## 2017-05-29 ENCOUNTER — Encounter (HOSPITAL_COMMUNITY): Payer: Self-pay | Admitting: Gastroenterology

## 2017-05-30 ENCOUNTER — Telehealth: Payer: Self-pay | Admitting: Gastroenterology

## 2017-05-30 NOTE — Telephone Encounter (Signed)
Tried to call. VM not set up. Mailing a letter to call.  

## 2017-05-30 NOTE — Telephone Encounter (Signed)
Pt called office and is aware.

## 2017-05-30 NOTE — Telephone Encounter (Signed)
Reminder in epic °

## 2017-05-30 NOTE — Telephone Encounter (Signed)
Please call pt. She had TWO simple adenomas AND TWO HYPERPLASTIC POLYPS removed.   DRINK WATER TO KEEP YOUR URINE LIGHT YELLOW. EAT FIBER. NEXT COLONOSCOPY IN 5-10 YEARS.

## 2017-06-03 ENCOUNTER — Telehealth: Payer: Self-pay | Admitting: Gastroenterology

## 2017-06-03 NOTE — Telephone Encounter (Signed)
Please call pt. She had TWO simple adenomas AND TWO HYPERPLASTIC POLYPS removed from her colon. NEXT TCS in 5-10 years. FOLLOW A High fiber diet.

## 2017-06-03 NOTE — Telephone Encounter (Signed)
Pt is aware.  

## 2017-07-10 ENCOUNTER — Ambulatory Visit: Payer: BLUE CROSS/BLUE SHIELD | Admitting: Gastroenterology

## 2017-07-10 ENCOUNTER — Encounter: Payer: Self-pay | Admitting: Gastroenterology

## 2017-07-10 VITALS — BP 127/79 | HR 81 | Temp 97.7°F | Ht 64.0 in | Wt 156.4 lb

## 2017-07-10 DIAGNOSIS — K219 Gastro-esophageal reflux disease without esophagitis: Secondary | ICD-10-CM

## 2017-07-10 MED ORDER — PANTOPRAZOLE SODIUM 40 MG PO TBEC: 40.0000 mg | DELAYED_RELEASE_TABLET | Freq: Every day | ORAL | 3 refills | Status: AC

## 2017-07-10 NOTE — Progress Notes (Signed)
Referring Provider: No ref. provider found Primary Care Physician:  System, Pcp Not In  Primary GI: Dr. Oneida Alar   Chief Complaint  Patient presents with  . Gastroesophageal Reflux    HPI:   Christina Dyer is a 56 y.o. female presenting today with a history of GERD, dysphagia, adenomas. Due for surveillance in 5-10 years. EGD recently completed with non-obstructing Schatzki's ring s/p dilation. Single gastric polyp. Mild gastritis.   Reflux better but sometimes flares up. Protonix once daily. No dysphagia. Mild abdominal discomfort, thinks it may be related to meds. No other concerns.   Past Medical History:  Diagnosis Date  . Abnormal glucose   . Anemia   . GERD (gastroesophageal reflux disease)   . High cholesterol   . Hypertension   . Vertigo     Past Surgical History:  Procedure Laterality Date  . CESAREAN SECTION     X 2  . COLONOSCOPY     about 25 years ago  NOTE: Patient  [name] incomplete in this note due to computer issues. See history tab for complete information. History tab information was reviewed with the patient and found to be correct.    Current Outpatient Medications  Medication Sig Dispense Refill  . acetaminophen (TYLENOL) 500 MG tablet Take 1,000 mg by mouth every 6 (six) hours as needed.    Marland Kitchen aspirin EC 81 MG tablet Take 81 mg by mouth daily.    . hydrochlorothiazide (HYDRODIURIL) 25 MG tablet Take 25 mg by mouth daily.    Marland Kitchen losartan (COZAAR) 25 MG tablet Take 25 mg by mouth daily.    . Multiple Vitamins-Minerals (CENTRUM ADULTS PO) Take 1 tablet by mouth daily.    . pantoprazole (PROTONIX) 40 MG tablet Take 1 tablet (40 mg total) daily by mouth. Take 30 minutes before breakfast. 90 tablet 3  . potassium chloride 20 MEQ TBCR Take 20 mEq by mouth 2 (two) times daily. (Patient taking differently: Take 20 mEq daily by mouth. ) 12 tablet 0  . rosuvastatin (CRESTOR) 20 MG tablet Take 1 tablet by mouth every evening.  3   No current  facility-administered medications for this visit.     Allergies as of 07/10/2017 - Review Complete 07/10/2017  Allergen Reaction Noted  . Clenpiq [sod picosulfate-mag ox-cit acd]  05/27/2017  . Codeine Other (See Comments) 08/29/2012  . Meperidine hcl Other (See Comments) 09/23/2007    Family History  Problem Relation Age of Onset  . Colon polyps Mother        53s  . Colon polyps Sister   . Colon cancer Neg Hx     Social History   Socioeconomic History  . Marital status: Married    Spouse name: None  . Number of children: None  . Years of education: None  . Highest education level: None  Social Needs  . Financial resource strain: None  . Food insecurity - worry: None  . Food insecurity - inability: None  . Transportation needs - medical: None  . Transportation needs - non-medical: None  Occupational History  . None  Tobacco Use  . Smoking status: Never Smoker  . Smokeless tobacco: Never Used  Substance and Sexual Activity  . Alcohol use: No  . Drug use: No  . Sexual activity: Yes    Birth control/protection: None  Other Topics Concern  . None  Social History Narrative  . None    Review of Systems: Gen: Denies fever, chills, anorexia. Denies fatigue,  weakness, weight loss.  CV: Denies chest pain, palpitations, syncope, peripheral edema, and claudication. Resp: Denies dyspnea at rest, cough, wheezing, coughing up blood, and pleurisy. GI: see HPI  Derm: Denies rash, itching, dry skin Psych: Denies depression, anxiety, memory loss, confusion. No homicidal or suicidal ideation.  Heme: Denies bruising, bleeding, and enlarged lymph nodes.  Physical Exam: BP 127/79   Pulse 81   Temp 97.7 F (36.5 C) (Oral)   Ht 5\' 4"  (1.626 m)   Wt 156 lb 6.4 oz (70.9 kg)   BMI 26.85 kg/m  General:   Alert and oriented. No distress noted. Pleasant and cooperative.  Head:  Normocephalic and atraumatic. Eyes:  Conjuctiva clear without scleral icterus. Mouth:  Oral mucosa  pink and moist.  Abdomen:  +BS, soft, non-tender and non-distended. No rebound or guarding. No HSM or masses noted. Msk:  Symmetrical without gross deformities. Normal posture. Extremities:  Without edema. Neurologic:  Alert and  oriented x4 Psych:  Alert and cooperative. Normal mood and affect.

## 2017-07-10 NOTE — Assessment & Plan Note (Signed)
Doing well on Protonix once daily. Dysphagia resolved s/p dilatation. Continue Protonix and return in 1 year. As of note, next colonoscopy in 5-10 years.

## 2017-07-10 NOTE — Patient Instructions (Signed)
Continue Protonix once daily. I have sent refills.  Your next colonoscopy will be in 5-10 years.   We will see you back in 1 year!  Please call if any issues whatsoever in the meantime!

## 2017-07-10 NOTE — Progress Notes (Signed)
PCP not in system,

## 2017-11-20 ENCOUNTER — Other Ambulatory Visit (HOSPITAL_COMMUNITY): Payer: Self-pay | Admitting: Nurse Practitioner

## 2017-11-20 DIAGNOSIS — Z1231 Encounter for screening mammogram for malignant neoplasm of breast: Secondary | ICD-10-CM

## 2018-03-19 ENCOUNTER — Ambulatory Visit (HOSPITAL_COMMUNITY): Payer: BLUE CROSS/BLUE SHIELD

## 2018-03-19 ENCOUNTER — Other Ambulatory Visit (HOSPITAL_COMMUNITY): Payer: Self-pay | Admitting: Nurse Practitioner

## 2018-03-19 DIAGNOSIS — N6310 Unspecified lump in the right breast, unspecified quadrant: Secondary | ICD-10-CM

## 2018-03-25 ENCOUNTER — Ambulatory Visit (HOSPITAL_COMMUNITY)
Admission: RE | Admit: 2018-03-25 | Discharge: 2018-03-25 | Disposition: A | Discharge status: Home / Self Care | Payer: BLUE CROSS/BLUE SHIELD | Source: Ambulatory Visit | Attending: Nurse Practitioner | Admitting: Nurse Practitioner

## 2018-03-25 ENCOUNTER — Encounter (HOSPITAL_COMMUNITY): Payer: Self-pay

## 2018-03-25 DIAGNOSIS — N6310 Unspecified lump in the right breast, unspecified quadrant: Secondary | ICD-10-CM | POA: Insufficient documentation

## 2018-06-12 ENCOUNTER — Encounter: Payer: Self-pay | Admitting: Gastroenterology

## 2018-11-04 IMAGING — MG DIGITAL DIAGNOSTIC BILATERAL MAMMOGRAM WITH TOMO AND CAD
8 series · 8 of 24 positions shown · non-contrast
Comparison: 06/14/2015 and 06/06/2015.

CLINICAL DATA: 56-year-old patient presents for delayed follow-up
of probably benign findings in the medial right breast and annual
examination of both breasts. She is asymptomatic. She had a
diagnostic right mammogram in 06/14/2015.

EXAM:
DIGITAL DIAGNOSTIC BILATERAL MAMMOGRAM WITH CAD AND TOMO

[L MLO]
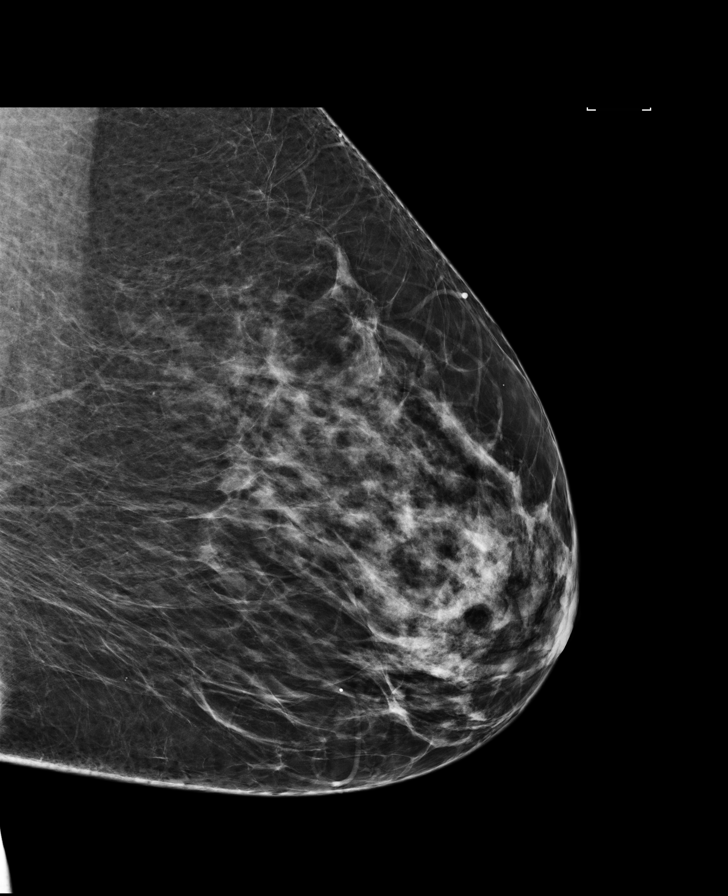

[R CC]
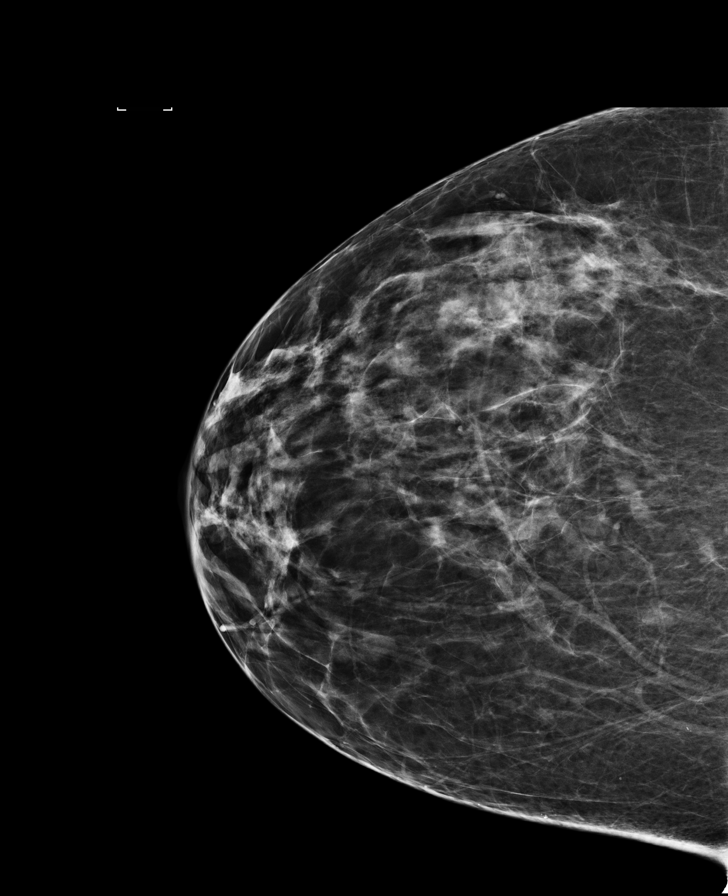

[L CC]
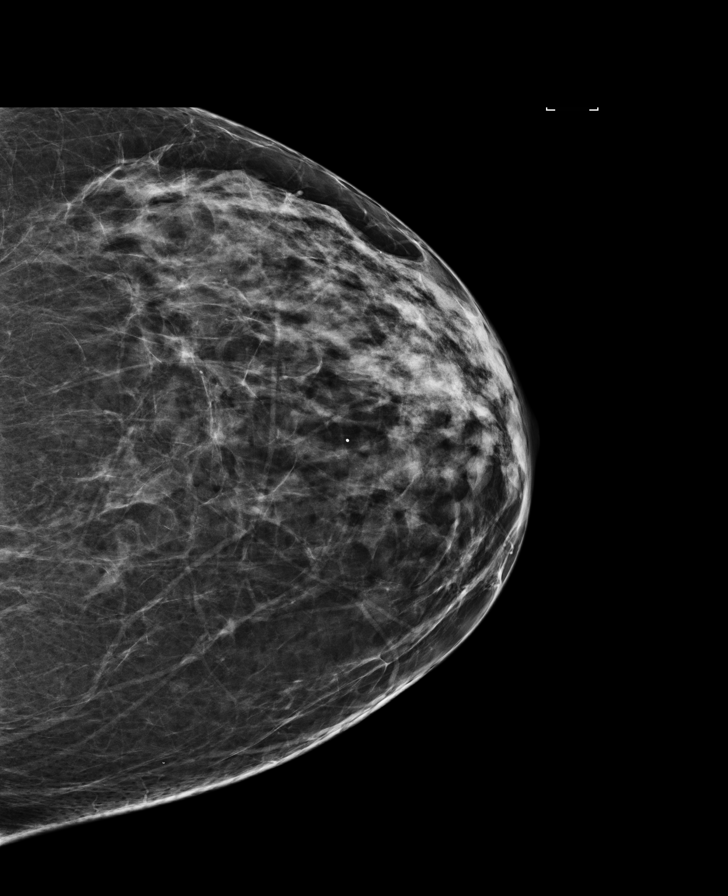

[R MLO]
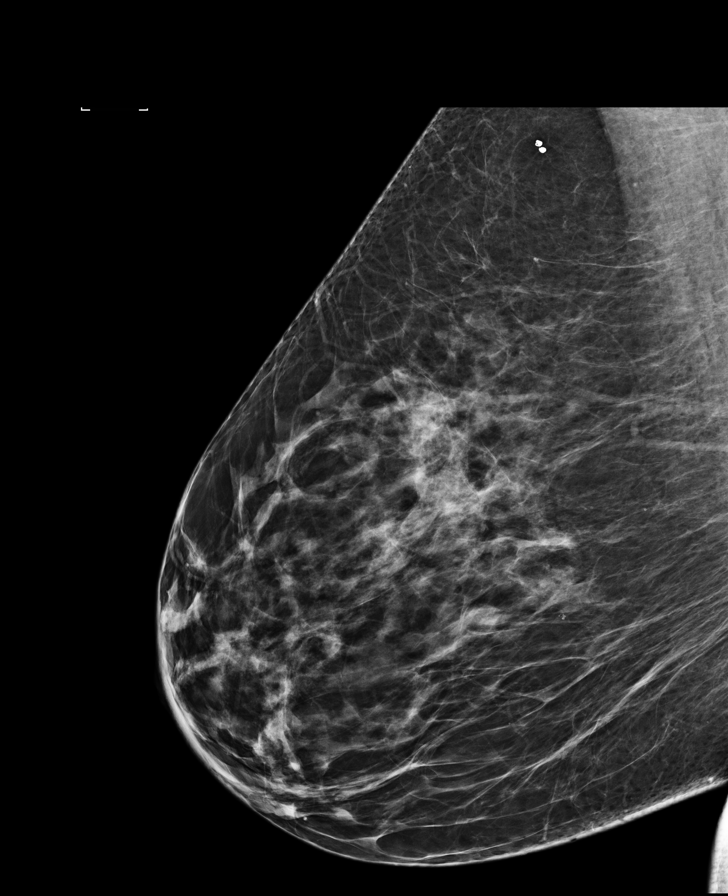

[R MLO tomo · tomo slice 41/80.0]
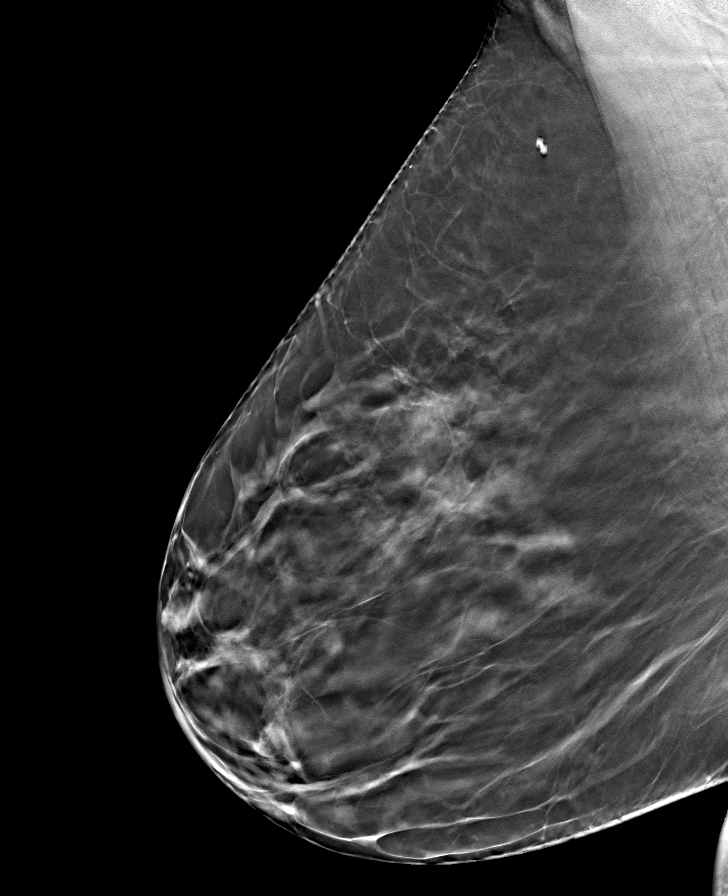

[L MLO tomo · tomo slice 41/82.0]
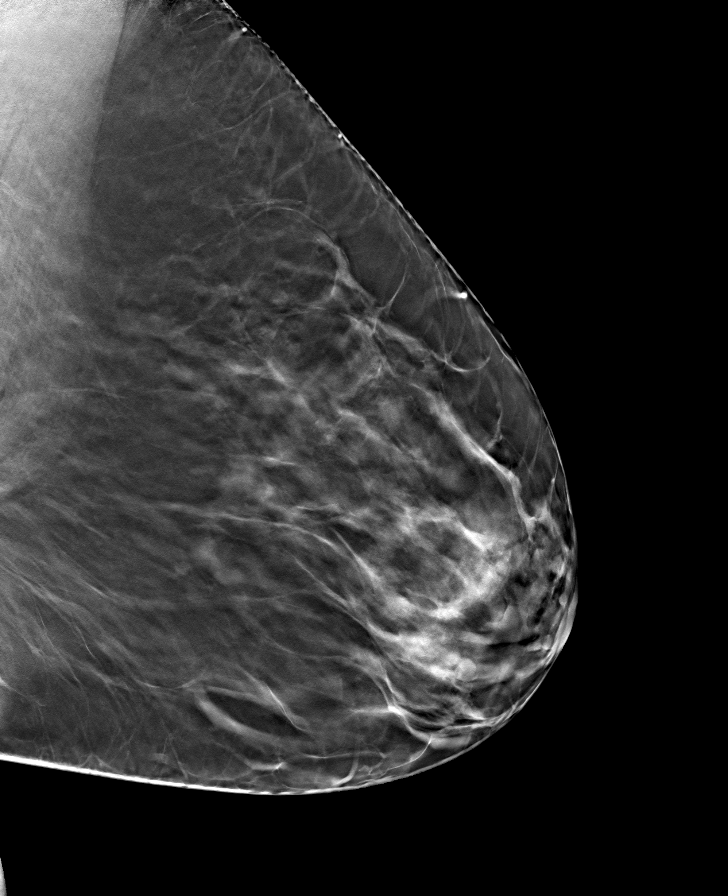

[L CC tomo · tomo slice 37/72.0]
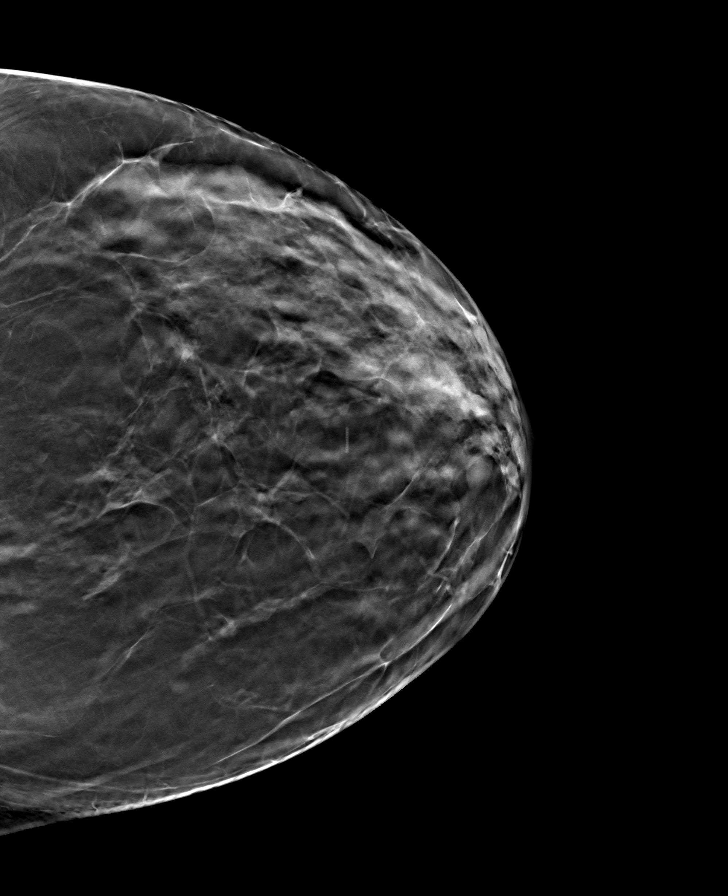

[R CC tomo · tomo slice 37/72.0]
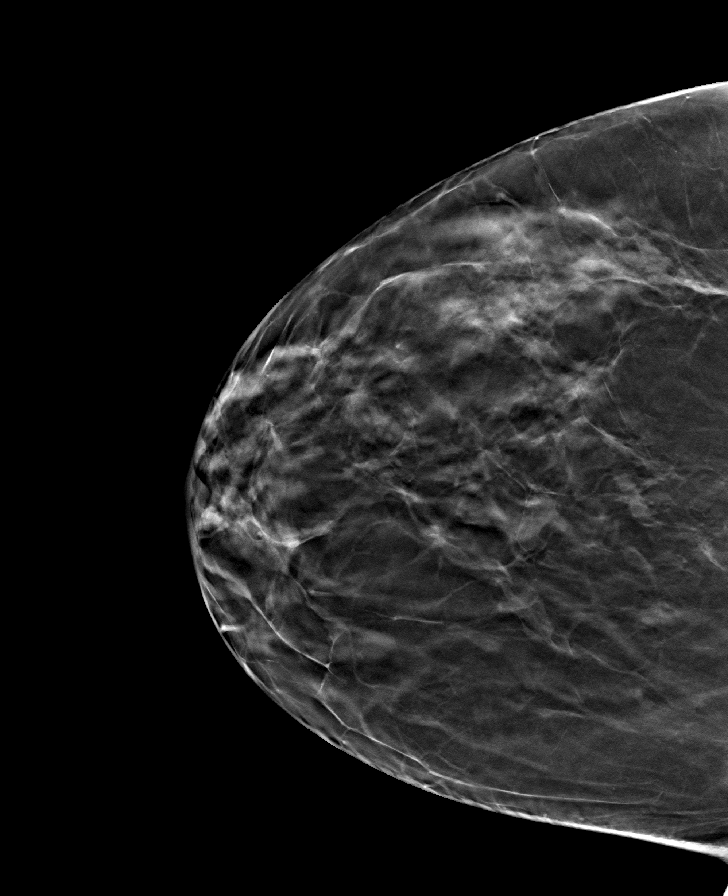

[8 of 24 positions shown; findings below may reference images not displayed]

04/12/2014, 02/02/2013.

ACR Breast Density Category c: The breast tissue is heterogeneously
dense, which may obscure small masses.
FINDINGS: Previously described low-density possible nodule in the
posteromedial right breast is mammographically stable for greater
than 2 years and can be considered benign. No suspicious mass,
architectural distortion, or suspicious microcalcification is
identified in either breast to suggest malignancy.

Mammographic images were processed with CAD.
IMPRESSION: No evidence of malignancy in either breast.

RECOMMENDATION:
Screening mammogram in one year.(Code:[id number])

I have discussed the findings and recommendations with the patient.
Results were also provided in writing at the conclusion of the
visit. If applicable, a reminder letter will be sent to the patient
regarding the next appointment.

BI-RADS CATEGORY  2: Benign.

## 2019-05-04 ENCOUNTER — Other Ambulatory Visit: Payer: Self-pay | Admitting: Family Medicine

## 2019-05-04 DIAGNOSIS — Z1231 Encounter for screening mammogram for malignant neoplasm of breast: Secondary | ICD-10-CM

## 2020-02-19 ENCOUNTER — Ambulatory Visit: Payer: BLUE CROSS/BLUE SHIELD | Admitting: Internal Medicine

## 2022-01-25 ENCOUNTER — Telehealth: Payer: Self-pay

## 2022-01-25 NOTE — Telephone Encounter (Signed)
Pt LMOVM requesting to schedule TCS. Her last one was 05/2017, Dr. Oneida Alar recommended repeat in 5-10 years. I don't see she is on a recall.

## 2022-01-25 NOTE — Telephone Encounter (Signed)
Looks like last TCS done by Dr. Jacquiline Doe 05/10/2020, findings: piecemeal polypectomy.  A repeat of 1 year was recommended.  Dr. Oneida Alar did one previously 05/27/2017 findings: tubular adenoma (x2) and hyperplastic polyps (x2).  5-10 year repeat was recommended.   Venetia Night, NP:  Can you review and let me know your recommendations?

## 2022-01-25 NOTE — Telephone Encounter (Signed)
Noted.  Pt will need to be triaged per Venetia Night, NP.

## 2022-01-30 ENCOUNTER — Encounter: Payer: Self-pay | Admitting: *Deleted

## 2022-02-27 ENCOUNTER — Encounter: Payer: Self-pay | Admitting: *Deleted

## 2022-02-28 NOTE — Progress Notes (Signed)
Last colonoscopy was in September 2021 with Dr. Selina Cooley, MD with [facility] with 18 mm polyp in the cecum removed piecemeal, two 4-5 mm polyps in the transverse colon removed, one 3 mm polyp in the descending colon removed.  Cecal polyp was sessile serrated, other polyps were adenomatous and sessile serrated.  Recommended 1 year repeat.  Looks like there was some discussion previously about whether or not patient was going to reestablish with [place].  If she is going to continue to follow with [place] for her screening colonoscopies and GI care, we can proceed with scheduling.  ASA 2.  She will need BMP at pre-op.

## 2022-03-01 ENCOUNTER — Encounter: Payer: Self-pay | Admitting: *Deleted

## 2022-03-01 DIAGNOSIS — Z86010 Personal history of colonic polyps: Secondary | ICD-10-CM

## 2022-03-01 MED ORDER — PEG 3350-KCL-NA BICARB-NACL 420 G PO SOLR: ORAL | 0 refills | Status: DC

## 2022-03-01 NOTE — Addendum Note (Signed)
Addended by: Cheron Every on: 03/01/2022 08:37 AM   Modules accepted: Orders

## 2022-03-01 NOTE — Progress Notes (Signed)
Spoke with pt. She reports she is keeping her care here with [place]. Pt scheduled for TCS with Dr. Abbey Chatters asa 2 on 8/1 at 9:30am. Aware will mail instructions. Also aware needs lab work done prior. Rx sent to pharmacy

## 2022-03-27 ENCOUNTER — Other Ambulatory Visit (HOSPITAL_COMMUNITY)
Admission: RE | Admit: 2022-03-27 | Discharge: 2022-03-27 | Disposition: A | Discharge status: Home / Self Care | Payer: 59 | Source: Ambulatory Visit | Attending: Internal Medicine | Admitting: Internal Medicine

## 2022-03-27 DIAGNOSIS — Z86010 Personal history of colonic polyps: Secondary | ICD-10-CM | POA: Diagnosis present

## 2022-03-27 LAB — BASIC METABOLIC PANEL
Anion gap: 8 (ref 5–15)
BUN: 12 mg/dL (ref 8–23)
CO2: 25 mmol/L (ref 22–32)
Calcium: 10.1 mg/dL (ref 8.9–10.3)
Chloride: 106 mmol/L (ref 98–111)
Creatinine, Ser: 0.7 mg/dL (ref 0.44–1.00)
GFR, Estimated: >60 mL/min (ref >60)
Glucose, Bld: 110 mg/dL — ABNORMAL HIGH (ref 70–99)
Potassium: 3.5 mmol/L (ref 3.5–5.1)
Sodium: 139 mmol/L (ref 135–145)

## 2022-04-03 ENCOUNTER — Ambulatory Visit (HOSPITAL_COMMUNITY): Payer: 59 | Admitting: Anesthesiology

## 2022-04-03 ENCOUNTER — Ambulatory Visit (HOSPITAL_COMMUNITY)
Admission: RE | Admit: 2022-04-03 | Discharge: 2022-04-03 | Disposition: A | Discharge status: Home / Self Care | Payer: 59 | Attending: Internal Medicine | Admitting: Internal Medicine

## 2022-04-03 ENCOUNTER — Encounter (HOSPITAL_COMMUNITY)
Admission: RE | Disposition: A | Discharge status: Home / Self Care | Payer: Self-pay | Source: Home / Self Care | Attending: Internal Medicine

## 2022-04-03 ENCOUNTER — Encounter (HOSPITAL_COMMUNITY): Payer: Self-pay

## 2022-04-03 ENCOUNTER — Other Ambulatory Visit: Payer: Self-pay

## 2022-04-03 DIAGNOSIS — K5730 Diverticulosis of large intestine without perforation or abscess without bleeding: Secondary | ICD-10-CM | POA: Diagnosis not present

## 2022-04-03 DIAGNOSIS — D759 Disease of blood and blood-forming organs, unspecified: Secondary | ICD-10-CM | POA: Diagnosis not present

## 2022-04-03 DIAGNOSIS — K635 Polyp of colon: Secondary | ICD-10-CM

## 2022-04-03 DIAGNOSIS — D649 Anemia, unspecified: Secondary | ICD-10-CM | POA: Diagnosis not present

## 2022-04-03 DIAGNOSIS — D123 Benign neoplasm of transverse colon: Secondary | ICD-10-CM | POA: Insufficient documentation

## 2022-04-03 DIAGNOSIS — D124 Benign neoplasm of descending colon: Secondary | ICD-10-CM | POA: Insufficient documentation

## 2022-04-03 DIAGNOSIS — Z09 Encounter for follow-up examination after completed treatment for conditions other than malignant neoplasm: Secondary | ICD-10-CM | POA: Diagnosis present

## 2022-04-03 DIAGNOSIS — K219 Gastro-esophageal reflux disease without esophagitis: Secondary | ICD-10-CM | POA: Diagnosis not present

## 2022-04-03 DIAGNOSIS — Z86010 Personal history of colonic polyps: Secondary | ICD-10-CM

## 2022-04-03 DIAGNOSIS — K648 Other hemorrhoids: Secondary | ICD-10-CM | POA: Insufficient documentation

## 2022-04-03 DIAGNOSIS — I10 Essential (primary) hypertension: Secondary | ICD-10-CM | POA: Diagnosis not present

## 2022-04-03 DIAGNOSIS — K5520 Angiodysplasia of colon without hemorrhage: Secondary | ICD-10-CM | POA: Diagnosis not present

## 2022-04-03 HISTORY — PX: POLYPECTOMY: SHX5525

## 2022-04-03 HISTORY — PX: COLONOSCOPY WITH PROPOFOL: SHX5780

## 2022-04-03 SURGERY — COLONOSCOPY WITH PROPOFOL
Anesthesia: General

## 2022-04-03 MED ORDER — LIDOCAINE HCL (CARDIAC) PF 100 MG/5ML IV SOSY
PREFILLED_SYRINGE | INTRAVENOUS | Status: DC | PRN
  Administered 2022-04-03: 50 mg via INTRAVENOUS

## 2022-04-03 MED ORDER — PROPOFOL 10 MG/ML IV BOLUS
INTRAVENOUS | Status: DC | PRN
  Administered 2022-04-03: 30 mg via INTRAVENOUS
  Administered 2022-04-03 (×3): 20 mg via INTRAVENOUS

## 2022-04-03 MED ORDER — PROPOFOL 500 MG/50ML IV EMUL
INTRAVENOUS | Status: DC | PRN
  Administered 2022-04-03: 200 ug/kg/min via INTRAVENOUS

## 2022-04-03 MED ORDER — LACTATED RINGERS IV SOLN: INTRAVENOUS | Status: DC

## 2022-04-03 NOTE — Op Note (Signed)
[facility] Patient Name: Christina Dyer Procedure Date: 04/03/2022 10:05 AM MRN: [id number] Date of Birth: December 03, 1960 Attending MD: Elon Alas. Abbey Chatters DO CSN: [id number] Age: 61 Admit Type: Outpatient Procedure:                Colonoscopy Indications:              High risk colon cancer surveillance: Personal                            history of adenoma (10 mm or greater in size) Providers:                Elon Alas. Abbey Chatters, DO, Tammy Vaught, RN, Randa Spike, Technician, Ladoris Gene, Technician Referring MD:              Medicines:                See the Anesthesia note for documentation of the                            administered medications Complications:            No immediate complications. Estimated Blood Loss:     Estimated blood loss was minimal. Procedure:                Pre-Anesthesia Assessment:                           - The anesthesia plan was to use monitored                            anesthesia care (MAC).                           After obtaining informed consent, the colonoscope                            was passed under direct vision. Throughout the                            procedure, the patient's blood pressure, pulse, and                            oxygen saturations were monitored continuously. The                            [id number]-HQ190L ([id number]) scope was introduced through                            the anus and advanced to the the cecum, identified                            by appendiceal orifice and ileocecal valve. The  colonoscopy was performed without difficulty. The                            patient tolerated the procedure well. The quality                            of the bowel preparation was evaluated using the                            BBPS [employer] Bowel Preparation Scale) with scores                            of: Right Colon = 3, Transverse Colon = 3 and Left                             Colon = 3 (entire mucosa seen well with no residual                            staining, small fragments of stool or opaque                            liquid). The total BBPS score equals 9. Scope In: 10:21:57 AM Scope Out: 10:39:05 AM Scope Withdrawal Time: 0 hours 12 minutes 35 seconds  Total Procedure Duration: 0 hours 17 minutes 8 seconds  Findings:      The perianal and digital rectal examinations were normal.      Non-bleeding internal hemorrhoids were found during endoscopy.      Multiple small-mouthed diverticula were found in the sigmoid colon.      A 8 mm polyp was found in the transverse colon. The polyp was sessile.       The polyp was removed with a cold snare. Resection and retrieval were       complete.      A 4 mm polyp was found in the transverse colon. The polyp was sessile.       The polyp was removed with a cold snare. Resection and retrieval were       complete.      A 5 mm polyp was found in the descending colon. The polyp was sessile.       The polyp was removed with a cold snare. Resection and retrieval were       complete.      Two medium-sized angiodysplastic lesions without bleeding were found in       the cecum. Impression:               - Non-bleeding internal hemorrhoids.                           - Diverticulosis in the sigmoid colon.                           - One 8 mm polyp in the transverse colon, removed                            with a cold snare. Resected and retrieved.                           -  One 4 mm polyp in the transverse colon, removed                            with a cold snare. Resected and retrieved.                           - One 5 mm polyp in the descending colon, removed                            with a cold snare. Resected and retrieved.                           - Two non-bleeding colonic angiodysplastic lesions. Moderate Sedation:      Per Anesthesia Care Recommendation:           - Patient has a contact number  available for                            emergencies. The signs and symptoms of potential                            delayed complications were discussed with the                            patient. Return to normal activities tomorrow.                            Written discharge instructions were provided to the                            patient.                           - Resume previous diet.                           - Continue present medications.                           - Await pathology results.                           - Repeat colonoscopy in 5 years for surveillance                            and history of multiple polyps prior.                           - Return to GI clinic PRN. Procedure Code(s):        --- Professional ---                           [phone], Colonoscopy, flexible; with removal of                            tumor(s), polyp(s), or  other lesion(s) by snare                            technique Diagnosis Code(s):        --- Professional ---                           Z86.010, Personal history of colonic polyps                           K64.8, Other hemorrhoids                           K63.5, Polyp of colon                           K55.20, Angiodysplasia of colon without hemorrhage                           K57.30, Diverticulosis of large intestine without                            perforation or abscess without bleeding CPT copyright 2019 American Medical Association. All rights reserved. The codes documented in this report are preliminary and upon coder review may  be revised to meet current compliance requirements. Elon Alas. Abbey Chatters, DO [place] Abbey Chatters, DO 04/03/2022 10:46:35 AM This report has been signed electronically. Number of Addenda: 0

## 2022-04-03 NOTE — Discharge Instructions (Addendum)
  Colonoscopy Discharge Instructions  Read the instructions outlined below and refer to this sheet in the next few weeks. These discharge instructions provide you with general information on caring for yourself after you leave the hospital. Your doctor may also give you specific instructions. While your treatment has been planned according to the most current medical practices available, unavoidable complications occasionally occur.   ACTIVITY You may resume your regular activity, but move at a slower pace for the next 24 hours.  Take frequent rest periods for the next 24 hours.  Walking will help get rid of the air and reduce the bloated feeling in your belly (abdomen).  No driving for 24 hours (because of the medicine (anesthesia) used during the test).   Do not sign any important legal documents or operate any machinery for 24 hours (because of the anesthesia used during the test).  NUTRITION Drink plenty of fluids.  You may resume your normal diet as instructed by your doctor.  Begin with a light meal and progress to your normal diet. Heavy or fried foods are harder to digest and may make you feel sick to your stomach (nauseated).  Avoid alcoholic beverages for 24 hours or as instructed.  MEDICATIONS You may resume your normal medications unless your doctor tells you otherwise.  WHAT YOU CAN EXPECT TODAY Some feelings of bloating in the abdomen.  Passage of more gas than usual.  Spotting of blood in your stool or on the toilet paper.  IF YOU HAD POLYPS REMOVED DURING THE COLONOSCOPY: No aspirin products for 7 days or as instructed.  No alcohol for 7 days or as instructed.  Eat a soft diet for the next 24 hours.  FINDING OUT THE RESULTS OF YOUR TEST Not all test results are available during your visit. If your test results are not back during the visit, make an appointment with your caregiver to find out the results. Do not assume everything is normal if you have not heard from your  caregiver or the medical facility. It is important for you to follow up on all of your test results.  SEEK IMMEDIATE MEDICAL ATTENTION IF: You have more than a spotting of blood in your stool.  Your belly is swollen (abdominal distention).  You are nauseated or vomiting.  You have a temperature over 101.  You have abdominal pain or discomfort that is severe or gets worse throughout the day.   Your colonoscopy revealed 3 polyp(s) which I removed successfully. Await pathology results, my office will contact you. I recommend repeating colonoscopy in 5 years for surveillance purposes.   You also have diverticulosis and internal hemorrhoids. I would recommend increasing fiber in your diet or adding OTC Benefiber/Metamucil. Be sure to drink at least 4 to 6 glasses of water daily. Follow-up with GI as needed.   I hope you have a great rest of your week!  Charles K. Carver, D.O. Gastroenterology and Hepatology [facility]  

## 2022-04-03 NOTE — Anesthesia Preprocedure Evaluation (Signed)
Anesthesia Evaluation  Patient identified by MRN, date of birth, ID band Patient awake    Reviewed: Allergy & Precautions, H&P , NPO status , Patient's Chart, lab work & pertinent test results, reviewed documented beta blocker date and time   Airway Mallampati: II  TM Distance: >3 FB Neck ROM: full    Dental no notable dental hx.    Pulmonary neg pulmonary ROS,    Pulmonary exam normal breath sounds clear to auscultation       Cardiovascular Exercise Tolerance: Good hypertension, negative cardio ROS   Rhythm:regular Rate:Normal     Neuro/Psych negative neurological ROS  negative psych ROS   GI/Hepatic Neg liver ROS, GERD  Medicated,  Endo/Other  negative endocrine ROS  Renal/GU negative Renal ROS  negative genitourinary   Musculoskeletal   Abdominal   Peds  Hematology  (+) Blood dyscrasia, anemia ,   Anesthesia Other Findings   Reproductive/Obstetrics negative OB ROS                             Anesthesia Physical Anesthesia Plan  ASA: 2  Anesthesia Plan: General   Post-op Pain Management:    Induction:   PONV Risk Score and Plan:   Airway Management Planned:   Additional Equipment:   Intra-op Plan:   Post-operative Plan:   Informed Consent: I have reviewed the patients History and Physical, chart, labs and discussed the procedure including the risks, benefits and alternatives for the proposed anesthesia with the patient or authorized representative who has indicated his/her understanding and acceptance.     Dental Advisory Given  Plan Discussed with: CRNA  Anesthesia Plan Comments:         Anesthesia Quick Evaluation

## 2022-04-03 NOTE — H&P (Signed)
Primary Care Physician:  Bunnie Pion, FNP Primary Gastroenterologist:  Dr. Abbey Chatters  Pre-Procedure History & Physical: HPI:  Christina Dyer is a 61 y.o. female is here for a colonoscopy to be performed for surveillance purposes. Last colonoscopy was in September 2021 with Dr. Selina Cooley, MD with [facility] with 18 mm polyp in the cecum removed piecemeal, two 4-5 mm polyps in the transverse colon removed, one 3 mm polyp in the descending colon removed.  Cecal polyp was sessile serrated, other polyps were adenomatous and sessile serrated.    Past Medical History:  Diagnosis Date   Abnormal glucose    Anemia    GERD (gastroesophageal reflux disease)    High cholesterol    Hypertension    Vertigo     Past Surgical History:  Procedure Laterality Date   BIOPSY  05/27/2017   Procedure: BIOPSY;  Surgeon: Danie Binder, MD;  [facility];  Service: Endoscopy;;  gastric   CESAREAN SECTION     X 2   COLONOSCOPY     about 25 years ago   COLONOSCOPY N/A 05/27/2017   Dr. Oneida Alar: three 3-5 mm polyps in the rectum and in the sigmoid, one 8 mm polyp at rectosigmoid, significant looping of LEFT COLON, internal hemorrhoids, 2 simple adenomas and 2 hyperplastric. Next surveillance 5-10 years   ESOPHAGOGASTRODUODENOSCOPY N/A 05/27/2017   Dr. Oneida Alar: non-obstructing Schatzki's ring s/p dilation. single gastric polyp. gastritis   POLYPECTOMY  05/27/2017   Procedure: POLYPECTOMY;  Surgeon: Danie Binder, MD;  [facility];  Service: Endoscopy;;  colon   SAVORY DILATION N/A 05/27/2017   Procedure: SAVORY DILATION;  Surgeon: Danie Binder, MD;  [facility];  Service: Endoscopy;  Laterality: N/A;    Prior to Admission medications   Medication Sig Start Date End Date Taking? Authorizing Provider  acetaminophen (TYLENOL) 500 MG tablet Take 1,000 mg by mouth every 6 (six) hours as needed for moderate pain.   Yes [provider]  atorvastatin (LIPITOR)  40 MG tablet Take 40 mg by mouth daily.   Yes [provider]  Cholecalciferol (D3) 50 MCG (2000 UT) TABS Take 2,000 Units by mouth daily.   Yes [provider]  Coenzyme Q10 (COQ10) 200 MG CAPS Take 200 mg by mouth daily.   Yes [provider]  hydrochlorothiazide (HYDRODIURIL) 25 MG tablet Take 25 mg by mouth daily.   Yes [provider]  Magnesium 200 MG TABS Take 200 mg by mouth daily.   Yes [provider]  Multiple Vitamins-Minerals (CENTRUM ADULTS PO) Take 1 tablet by mouth daily.   Yes [provider]  pantoprazole (PROTONIX) 40 MG tablet Take 1 tablet (40 mg total) daily by mouth. Take 30 minutes before breakfast. 07/10/17  Yes Annitta Needs, NP  telmisartan (MICARDIS) 20 MG tablet Take 20 mg by mouth daily.   Yes [provider]  vitamin B-12 (CYANOCOBALAMIN) 1000 MCG tablet Take 1,000 mcg by mouth daily.   Yes [provider]  Zinc 30 MG TABS Take 30 mg by mouth daily.   Yes [provider]  polyethylene glycol-electrolytes (NULYTELY) 420 g solution As directed 03/01/22   Eloise Harman, DO    Allergies as of 03/01/2022 - Review Complete 07/10/2017  Allergen Reaction Noted   Clenpiq [sod picosulfate-mag ox-cit acd]  05/27/2017   Codeine Other (See Comments) 08/29/2012   Meperidine hcl Other (See Comments) 09/23/2007    Family History  Problem  Relation Age of Onset   Colon polyps Mother        53s   Colon polyps Sister    Colon cancer Neg Hx     Social History   Socioeconomic History   Marital status: Married    Spouse name: Not on file   Number of children: Not on file   Years of education: Not on file   Highest education level: Not on file  Occupational History   Not on file  Tobacco Use   Smoking status: Never   Smokeless tobacco: Never  Vaping Use   Vaping Use: Never used  Substance and Sexual Activity   Alcohol use: No   Drug use: No   Sexual activity: Yes    Birth  control/protection: None  Other Topics Concern   Not on file  Social History Narrative   Not on file   Social Determinants of Health   Financial Resource Strain: Not on file  Food Insecurity: Not on file  Transportation Needs: Not on file  Physical Activity: Not on file  Stress: Not on file  Social Connections: Not on file  Intimate Partner Violence: Not on file    Review of Systems: See HPI, otherwise negative ROS  Physical Exam: Vital signs in last 24 hours: Temp:  [98.5 F (36.9 C)] 98.5 F (36.9 C) (08/01 0839) Resp:  [16] 16 (08/01 0839) BP: (151)/(76) 151/76 (08/01 0839) SpO2:  [100 %] 100 % (08/01 0839) Weight:  [71.7 kg] 71.7 kg (08/01 0839)   General:   Alert,  Well-developed, well-nourished, pleasant and cooperative in NAD Head:  Normocephalic and atraumatic. Eyes:  Sclera clear, no icterus.   Conjunctiva pink. Ears:  Normal auditory acuity. Nose:  No deformity, discharge,  or lesions. Mouth:  No deformity or lesions, dentition normal. Neck:  Supple; no masses or thyromegaly. Lungs:  Clear throughout to auscultation.   No wheezes, crackles, or rhonchi. No acute distress. Heart:  Regular rate and rhythm; no murmurs, clicks, rubs,  or gallops. Abdomen:  Soft, nontender and nondistended. No masses, hepatosplenomegaly or hernias noted. Normal bowel sounds, without guarding, and without rebound.   Msk:  Symmetrical without gross deformities. Normal posture. Extremities:  Without clubbing or edema. Neurologic:  Alert and  oriented x4;  grossly normal neurologically. Skin:  Intact without significant lesions or rashes. Cervical Nodes:  No significant cervical adenopathy. Psych:  Alert and cooperative. Normal mood and affect.  Impression/Plan: Christina Dyer is here for a colonoscopy to be performed for surveillance purposes. Last colonoscopy was in September 2021 with Dr. Selina Cooley, MD with [facility] with 18 mm polyp in the cecum removed piecemeal, two  4-5 mm polyps in the transverse colon removed, one 3 mm polyp in the descending colon removed.  Cecal polyp was sessile serrated, other polyps were adenomatous and sessile serrated.    The risks of the procedure including infection, bleed, or perforation as well as benefits, limitations, alternatives and imponderables have been reviewed with the patient. Questions have been answered. All parties agreeable.

## 2022-04-03 NOTE — Transfer of Care (Signed)
Immediate Anesthesia Transfer of Care Note  Patient: Christina Dyer  Procedure(s) Performed: COLONOSCOPY WITH PROPOFOL POLYPECTOMY  Patient Location: PACU  Anesthesia Type:MAC  Level of Consciousness: drowsy  Airway & Oxygen Therapy: Patient Spontanous Breathing  Post-op Assessment: Report given to RN and Post -op Vital signs reviewed and stable  Post vital signs: Reviewed and stable  Last Vitals:  Vitals Value Taken Time  BP 99/53 04/03/22 1042  Temp 36.5 C 04/03/22 1042  Pulse 72 04/03/22 1043  Resp 18 04/03/22 1043  SpO2 100 % 04/03/22 1043  Vitals shown include unvalidated device data.  Last Pain:  Vitals:   04/03/22 1042  TempSrc: Oral  PainSc:       Patients Stated Pain Goal: 6 (95/32/02 3343)  Complications: No notable events documented.

## 2022-04-04 LAB — SURGICAL PATHOLOGY

## 2022-04-04 NOTE — Anesthesia Postprocedure Evaluation (Signed)
Anesthesia Post Note  Patient: Christina Dyer  Procedure(s) Performed: COLONOSCOPY WITH PROPOFOL POLYPECTOMY  Patient location during evaluation: Phase II Anesthesia Type: General Level of consciousness: awake Pain management: pain level controlled Vital Signs Assessment: post-procedure vital signs reviewed and stable Respiratory status: spontaneous breathing and respiratory function stable Cardiovascular status: blood pressure returned to baseline and stable Postop Assessment: no headache and no apparent nausea or vomiting Anesthetic complications: no Comments: Late entry   No notable events documented.   Last Vitals:  Vitals:   04/03/22 1045 04/03/22 1047  BP: 112/65 112/65  Pulse: 86 79  Resp: 18 20  Temp:    SpO2: 100% 100%    Last Pain:  Vitals:   04/03/22 1045  TempSrc:   PainSc: 0-No pain                 Louann Sjogren

## 2022-04-10 ENCOUNTER — Encounter (HOSPITAL_COMMUNITY): Payer: Self-pay | Admitting: Internal Medicine

## 2023-12-24 ENCOUNTER — Ambulatory Visit (INDEPENDENT_AMBULATORY_CARE_PROVIDER_SITE_OTHER): Admitting: Gastroenterology

## 2023-12-24 ENCOUNTER — Encounter: Payer: Self-pay | Admitting: Gastroenterology

## 2023-12-24 VITALS — BP 130/78 | HR 74 | Temp 98.5°F | Ht 64.0 in | Wt 149.8 lb

## 2023-12-24 DIAGNOSIS — K219 Gastro-esophageal reflux disease without esophagitis: Secondary | ICD-10-CM | POA: Diagnosis not present

## 2023-12-24 DIAGNOSIS — R1013 Epigastric pain: Secondary | ICD-10-CM | POA: Diagnosis not present

## 2023-12-24 DIAGNOSIS — Z860101 Personal history of adenomatous and serrated colon polyps: Secondary | ICD-10-CM

## 2023-12-24 DIAGNOSIS — Z8719 Personal history of other diseases of the digestive system: Secondary | ICD-10-CM

## 2023-12-24 MED ORDER — OMEPRAZOLE 40 MG PO CPDR: 40.0000 mg | DELAYED_RELEASE_CAPSULE | Freq: Every day | ORAL | 3 refills | Status: AC

## 2023-12-24 NOTE — Progress Notes (Signed)
 Gastroenterology Office Note     Primary Care Physician:  Evelena Hines, FNP (Inactive)  Primary Gastroenterologist: Dr. Mordechai April    Chief Complaint   Chief Complaint  Patient presents with   Abdominal Pain    Having upper mid abdominal pain for 2 -3 months. Pain is cramping or sharp. Pain is worse when lying down. Pain wakes her from sleep. Has some nausea at times.      History of Present Illness   Christina Dyer is a pleasant 63 y.o. female presenting today with a history of GERD, dysphagia s/p dilation in 2018, adenomas, last seen in 2018.   Abdominal pain onset 2-3 months ago. Not present during the day. Wakes up in early mornings with crampy pain in epigastric area. Relieved after breakfast. Not much during day. Occurring 3-4 times a week. No dysphagia. Good appetite. No postprandial pain. Mild nausea in the morning. Sometimes will have a little snack at 730 then lay down around 930. Pantoprazole  once a day. Feels like it doesn't work as well as it did when first starting. Has taken OTC prilosec. No unexplained weight loss. Rare NSAIDs. No overt GI bleeding. No constipation or diarrhea. Bowels are regular. Sometimes wakes up with saliva in throat.   Wants to hold off on EGD and CT.    EGD 2018:  non-obstructing Schatzki's ring s/p dilation. Single gastric polyp. Mild gastritis.   Colonoscopy Aug 2023: non-bleedin ginernal hemorrhoids, diverticula, multiple polyps (sessile serrated and tubular adenoma). 5 year surveillance  Outside LFTs normal Sept 2024 (T bili 0.3, Alk Phos 94, AST 20, ALT 25  Past Medical History:  Diagnosis Date   Abnormal glucose    Anemia    GERD (gastroesophageal reflux disease)    High cholesterol    Hypertension    Vertigo     Past Surgical History:  Procedure Laterality Date   BIOPSY  05/27/2017   Procedure: BIOPSY;  Surgeon: Alyce Jubilee, MD;  [facility];  Service: Endoscopy;;  gastric   CESAREAN SECTION     X 2    COLONOSCOPY     about 25 years ago   COLONOSCOPY N/A 05/27/2017   Dr. Nolene Baumgarten: three 3-5 mm polyps in the rectum and in the sigmoid, one 8 mm polyp at rectosigmoid, significant looping of LEFT COLON, internal hemorrhoids, 2 simple adenomas and 2 hyperplastric. Next surveillance 5-10 years   COLONOSCOPY WITH PROPOFOL  N/A 04/03/2022   Procedure: COLONOSCOPY WITH PROPOFOL ;  Surgeon: Vinetta Greening, DO;  [facility];  Service: Endoscopy;  Laterality: N/A;  9:30am   ESOPHAGOGASTRODUODENOSCOPY N/A 05/27/2017   Dr. Nolene Baumgarten: non-obstructing Schatzki's ring s/p dilation. single gastric polyp. gastritis   POLYPECTOMY  05/27/2017   Procedure: POLYPECTOMY;  Surgeon: Alyce Jubilee, MD;  [facility];  Service: Endoscopy;;  colon   POLYPECTOMY  04/03/2022   Procedure: POLYPECTOMY;  Surgeon: Vinetta Greening, DO;  [facility];  Service: Endoscopy;;   SAVORY DILATION N/A 05/27/2017   Procedure: SAVORY DILATION;  Surgeon: Alyce Jubilee, MD;  [facility];  Service: Endoscopy;  Laterality: N/A;    Current Outpatient Medications  Medication Sig Dispense Refill   acetaminophen  (TYLENOL ) 500 MG tablet Take 1,000 mg by mouth every 6 (six) hours as needed for moderate pain.     atorvastatin (LIPITOR) 40 MG tablet Take 40 mg by mouth daily.     Cholecalciferol (D3) 50 MCG (2000 UT) TABS Take 2,000  Units by mouth daily.     Coenzyme Q10 (COQ10) 200 MG CAPS Take 200 mg by mouth daily.     hydrochlorothiazide (HYDRODIURIL) 25 MG tablet Take 25 mg by mouth daily.     Magnesium 200 MG TABS Take 200 mg by mouth daily.     Multiple Vitamins-Minerals (CENTRUM ADULTS PO) Take 1 tablet by mouth daily.     pantoprazole  (PROTONIX ) 40 MG tablet Take 1 tablet (40 mg total) daily by mouth. Take 30 minutes before breakfast. 90 tablet 3   telmisartan (MICARDIS) 20 MG tablet Take 20 mg by mouth daily.     vitamin B-12 (CYANOCOBALAMIN) 1000 MCG tablet Take 1,000 mcg by mouth daily.      Zinc 30 MG TABS Take 30 mg by mouth daily.     No current facility-administered medications for this visit.    Allergies as of 12/24/2023 - Review Complete 12/24/2023  Allergen Reaction Noted   Clenpiq  [sod picosulfate-mag ox-cit acd]  05/27/2017   Codeine Other (See Comments) 08/29/2012   Meperidine  hcl Other (See Comments) 09/23/2007    Family History  Problem Relation Age of Onset   Colon polyps Mother        83s   Colon polyps Sister    Colon cancer Neg Hx     Social History   Socioeconomic History   Marital status: Married    Spouse name: Not on file   Number of children: Not on file   Years of education: Not on file   Highest education level: Not on file  Occupational History   Not on file  Tobacco Use   Smoking status: Never   Smokeless tobacco: Never  Vaping Use   Vaping status: Never Used  Substance and Sexual Activity   Alcohol use: No   Drug use: No   Sexual activity: Yes    Birth control/protection: None  Other Topics Concern   Not on file  Social History Narrative   Not on file   Social Drivers of Health   Financial Resource Strain: Not on file  Food Insecurity: Not on file  Transportation Needs: Not on file  Physical Activity: Not on file  Stress: Not on file  Social Connections: Not on file  Intimate Partner Violence: Not on file     Review of Systems   Gen: Denies any fever, chills, fatigue, weight loss, lack of appetite.  CV: Denies chest pain, heart palpitations, peripheral edema, syncope.  Resp: Denies shortness of breath at rest or with exertion. Denies wheezing or cough.  GI: Denies dysphagia or odynophagia. Denies jaundice, hematemesis, fecal incontinence. GU : Denies urinary burning, urinary frequency, urinary hesitancy MS: Denies joint pain, muscle weakness, cramps, or limitation of movement.  Derm: Denies rash, itching, dry skin Psych: Denies depression, anxiety, memory loss, and confusion Heme: Denies bruising, bleeding,  and enlarged lymph nodes.   Physical Exam   BP 130/78   Pulse 74   Temp 98.5 F (36.9 C) (Oral)   Ht 5\' 4"  (1.626 m)   Wt 149 lb 12.8 oz (67.9 kg)   BMI 25.71 kg/m  General:   Alert and oriented. Pleasant and cooperative. Well-nourished and well-developed.  Head:  Normocephalic and atraumatic. Eyes:  Without icterus Abdomen:  +BS, soft, non-tender and non-distended. No HSM noted. No guarding or rebound. No masses appreciated.  Rectal:  Deferred  Msk:  Symmetrical without gross deformities. Normal posture. Extremities:  Without edema. Neurologic:  Alert and  oriented x4;  grossly normal neurologically.  Skin:  Intact without significant lesions or rashes. Psych:  Alert and cooperative. Normal mood and affect.   Assessment   Christina Dyer is a 63 y.o. female presenting today with a history of GERD, dysphagia s/p dilation in 2018, adenomas, last seen in 2018, now with several month history of dyspepsia.  Dyspepsia: noting epigastric discomfort particularly in the morning and on waking, relieved after eating breakfast, mild nausea associated but no vomiting, no pain during day. No other alarm signs such as dysphagia, weight loss, loss of appetite, early satiety, overt GI bleeding. Chronic GERD noted and feels pantoprazole  is not effective as previously. I offered EGD for diagnostic purposes, but she would like to hold off on this. She would also like to hold off on CT scan. We will change PPI therapy, have her take before last meal of the day, and employ GERD diet/behavior modification. Close follow-up in 2 months.    Colonoscopy Aug 2023: non-bleeding internal hemorrhoids, diverticula, multiple polyps (sessile serrated and tubular adenoma). 5 year surveillance  Outside LFTs normal Sept 2024 (T bili 0.3, Alk Phos 94, AST 20, ALT 25   PLAN   Stop pantoprazole . Start omeprazole  40 mg daily before dinner GERD/behavior modifications Recommend EGD but declining currently and  declining CT Close follow-up in 2months Colonoscopy 2028   Delman Ferns, PhD, ANP-BC [facility] Gastroenterology

## 2023-12-24 NOTE — Patient Instructions (Signed)
 Let's stop pantoprazole . Instead, start omeprazole  daily, 30 minutes before eating. We can try this with the last meal of the day to see how this works for you!  I recommend not eating at least 3 hours before laying down. I have included a handout as well.  If you have any worsening symptoms or no improvement, please call us !  Otherwise, we will see you in 2 months!  I enjoyed seeing you again today! I value our relationship and want to provide genuine, compassionate, and quality care. You may receive a survey regarding your visit with me, and I welcome your feedback! Thanks so much for taking the time to complete this. I look forward to seeing you again.      Delman Ferns, PhD, ANP-BC [facility] Gastroenterology

## 2024-02-20 ENCOUNTER — Ambulatory Visit: Admitting: Gastroenterology

## 2024-02-20 ENCOUNTER — Encounter: Payer: Self-pay | Admitting: Gastroenterology

## 2024-07-24 ENCOUNTER — Inpatient Hospital Stay
Admission: RE | Admit: 2024-07-24 | Discharge: 2024-07-24 | Disposition: A | Discharge status: Home / Self Care | Payer: Self-pay | Source: Ambulatory Visit | Attending: Family Medicine | Admitting: Family Medicine

## 2024-07-24 ENCOUNTER — Other Ambulatory Visit: Payer: Self-pay | Admitting: *Deleted

## 2024-07-24 DIAGNOSIS — Z1231 Encounter for screening mammogram for malignant neoplasm of breast: Secondary | ICD-10-CM

## 2024-08-07 ENCOUNTER — Other Ambulatory Visit: Payer: Self-pay | Admitting: Family Medicine

## 2024-08-07 DIAGNOSIS — Z1231 Encounter for screening mammogram for malignant neoplasm of breast: Secondary | ICD-10-CM

## 2024-09-15 ENCOUNTER — Encounter
